# Patient Record
Sex: Female | Born: 1950 | Race: White | Hispanic: No | Marital: Single | Smoking: Never smoker
Health system: Southern US, Community
[De-identification: ages and names within clinical notes are randomized; demographics above are authoritative.]

## PROBLEM LIST (undated history)

## (undated) DIAGNOSIS — E785 Hyperlipidemia, unspecified: Secondary | ICD-10-CM

## (undated) DIAGNOSIS — H269 Unspecified cataract: Secondary | ICD-10-CM

## (undated) DIAGNOSIS — E079 Disorder of thyroid, unspecified: Secondary | ICD-10-CM

## (undated) DIAGNOSIS — M1990 Unspecified osteoarthritis, unspecified site: Secondary | ICD-10-CM

## (undated) HISTORY — PX: HERNIA REPAIR: SHX51

## (undated) HISTORY — DX: Unspecified osteoarthritis, unspecified site: M19.90

## (undated) HISTORY — PX: COLONOSCOPY: SHX174

## (undated) HISTORY — DX: Hyperlipidemia, unspecified: E78.5

## (undated) HISTORY — DX: Unspecified cataract: H26.9

## (undated) HISTORY — DX: Disorder of thyroid, unspecified: E07.9

---

## 1999-09-20 ENCOUNTER — Encounter: Payer: Self-pay | Admitting: Emergency Medicine

## 1999-09-20 ENCOUNTER — Emergency Department (HOSPITAL_COMMUNITY): Admission: EM | Admit: 1999-09-20 | Discharge: 1999-09-20 | Payer: Self-pay | Admitting: Emergency Medicine

## 2000-07-04 ENCOUNTER — Encounter: Payer: Self-pay | Admitting: Obstetrics and Gynecology

## 2000-07-04 ENCOUNTER — Encounter: Admission: RE | Admit: 2000-07-04 | Discharge: 2000-07-04 | Payer: Self-pay | Admitting: Obstetrics and Gynecology

## 2000-10-24 ENCOUNTER — Other Ambulatory Visit: Admission: RE | Admit: 2000-10-24 | Discharge: 2000-10-24 | Payer: Self-pay | Admitting: Obstetrics and Gynecology

## 2001-07-14 ENCOUNTER — Encounter: Payer: Self-pay | Admitting: Obstetrics and Gynecology

## 2001-07-14 ENCOUNTER — Encounter: Admission: RE | Admit: 2001-07-14 | Discharge: 2001-07-14 | Payer: Self-pay | Admitting: Obstetrics and Gynecology

## 2001-11-11 ENCOUNTER — Other Ambulatory Visit: Admission: RE | Admit: 2001-11-11 | Discharge: 2001-11-11 | Payer: Self-pay | Admitting: Obstetrics and Gynecology

## 2002-07-27 ENCOUNTER — Encounter: Payer: Self-pay | Admitting: Obstetrics and Gynecology

## 2002-07-27 ENCOUNTER — Ambulatory Visit (HOSPITAL_COMMUNITY): Admission: RE | Admit: 2002-07-27 | Discharge: 2002-07-27 | Payer: Self-pay | Admitting: Obstetrics and Gynecology

## 2002-12-06 ENCOUNTER — Other Ambulatory Visit: Admission: RE | Admit: 2002-12-06 | Discharge: 2002-12-06 | Payer: Self-pay | Admitting: Obstetrics and Gynecology

## 2003-08-09 ENCOUNTER — Encounter: Payer: Self-pay | Admitting: Obstetrics and Gynecology

## 2003-08-09 ENCOUNTER — Ambulatory Visit (HOSPITAL_COMMUNITY): Admission: RE | Admit: 2003-08-09 | Discharge: 2003-08-09 | Payer: Self-pay | Admitting: Obstetrics and Gynecology

## 2003-12-28 ENCOUNTER — Other Ambulatory Visit: Admission: RE | Admit: 2003-12-28 | Discharge: 2003-12-28 | Payer: Self-pay | Admitting: Obstetrics and Gynecology

## 2004-09-04 ENCOUNTER — Ambulatory Visit (HOSPITAL_COMMUNITY): Admission: RE | Admit: 2004-09-04 | Discharge: 2004-09-04 | Payer: Self-pay | Admitting: Obstetrics and Gynecology

## 2005-01-24 ENCOUNTER — Other Ambulatory Visit: Admission: RE | Admit: 2005-01-24 | Discharge: 2005-01-24 | Payer: Self-pay | Admitting: Obstetrics and Gynecology

## 2005-01-30 DIAGNOSIS — D229 Melanocytic nevi, unspecified: Secondary | ICD-10-CM

## 2005-01-30 HISTORY — DX: Melanocytic nevi, unspecified: D22.9

## 2006-07-07 ENCOUNTER — Ambulatory Visit (HOSPITAL_COMMUNITY): Admission: RE | Admit: 2006-07-07 | Discharge: 2006-07-07 | Payer: Self-pay | Admitting: Obstetrics and Gynecology

## 2007-08-20 ENCOUNTER — Ambulatory Visit (HOSPITAL_COMMUNITY): Admission: RE | Admit: 2007-08-20 | Discharge: 2007-08-20 | Payer: Self-pay | Admitting: Obstetrics and Gynecology

## 2007-09-08 ENCOUNTER — Ambulatory Visit: Payer: Self-pay | Admitting: Internal Medicine

## 2007-09-21 ENCOUNTER — Ambulatory Visit: Payer: Self-pay | Admitting: Internal Medicine

## 2008-09-20 ENCOUNTER — Ambulatory Visit (HOSPITAL_COMMUNITY): Admission: RE | Admit: 2008-09-20 | Discharge: 2008-09-20 | Payer: Self-pay | Admitting: Obstetrics and Gynecology

## 2009-12-14 ENCOUNTER — Ambulatory Visit (HOSPITAL_COMMUNITY): Admission: RE | Admit: 2009-12-14 | Discharge: 2009-12-14 | Payer: Self-pay | Admitting: Obstetrics and Gynecology

## 2010-12-18 ENCOUNTER — Ambulatory Visit (HOSPITAL_COMMUNITY)
Admission: RE | Admit: 2010-12-18 | Discharge: 2010-12-18 | Payer: Self-pay | Source: Home / Self Care | Attending: Obstetrics and Gynecology | Admitting: Obstetrics and Gynecology

## 2012-02-14 ENCOUNTER — Emergency Department (HOSPITAL_COMMUNITY)
Admission: EM | Admit: 2012-02-14 | Discharge: 2012-02-14 | Disposition: A | Discharge status: Home / Self Care | Payer: Self-pay | Attending: Emergency Medicine | Admitting: Emergency Medicine

## 2012-02-14 ENCOUNTER — Encounter (HOSPITAL_COMMUNITY): Payer: Self-pay | Admitting: *Deleted

## 2012-02-14 ENCOUNTER — Emergency Department (HOSPITAL_COMMUNITY): Payer: Self-pay

## 2012-02-14 ENCOUNTER — Other Ambulatory Visit: Payer: Self-pay

## 2012-02-14 DIAGNOSIS — Z79899 Other long term (current) drug therapy: Secondary | ICD-10-CM | POA: Insufficient documentation

## 2012-02-14 DIAGNOSIS — R109 Unspecified abdominal pain: Secondary | ICD-10-CM | POA: Insufficient documentation

## 2012-02-14 LAB — CBC
MCH: 29.6 pg (ref 26.0–34.0)
MCHC: 33.2 g/dL (ref 30.0–36.0)
MCV: 89.2 fL (ref 78.0–100.0)
Platelets: 371 10*3/uL (ref 150–400)
RBC: 4.43 MIL/uL (ref 3.87–5.11)

## 2012-02-14 LAB — DIFFERENTIAL
Eosinophils Absolute: 0.1 10*3/uL (ref 0.0–0.7)
Eosinophils Relative: 2 % (ref 0–5)
Lymphs Abs: 2.2 10*3/uL (ref 0.7–4.0)
Monocytes Relative: 6 % (ref 3–12)

## 2012-02-14 LAB — URINALYSIS, ROUTINE W REFLEX MICROSCOPIC
Nitrite: NEGATIVE
Specific Gravity, Urine: 1.01 (ref 1.005–1.030)
Urobilinogen, UA: 0.2 mg/dL (ref 0.0–1.0)

## 2012-02-14 LAB — COMPREHENSIVE METABOLIC PANEL
BUN: 9 mg/dL (ref 6–23)
Calcium: 9.4 mg/dL (ref 8.4–10.5)
GFR calc Af Amer: >90 mL/min (ref >90)
Glucose, Bld: 102 mg/dL — ABNORMAL HIGH (ref 70–99)
Sodium: 138 mEq/L (ref 135–145)
Total Protein: 7 g/dL (ref 6.0–8.3)

## 2012-02-14 LAB — LIPASE, BLOOD: Lipase: 19 U/L (ref 11–59)

## 2012-02-14 MED ORDER — PANTOPRAZOLE SODIUM 20 MG PO TBEC: 40.0000 mg | DELAYED_RELEASE_TABLET | Freq: Every day | ORAL | Status: DC

## 2012-02-14 MED ORDER — SODIUM CHLORIDE 0.9 % IV SOLN
Freq: Once | INTRAVENOUS | Status: AC
  Administered 2012-02-14: 18:00:00 via INTRAVENOUS

## 2012-02-14 MED ORDER — HYDROCODONE-ACETAMINOPHEN 5-325 MG PO TABS: 1.0000 | ORAL_TABLET | Freq: Four times a day (QID) | ORAL | Status: AC | PRN

## 2012-02-14 MED ORDER — IOHEXOL 300 MG/ML  SOLN
70.0000 mL | Freq: Once | INTRAMUSCULAR | Status: AC | PRN
  Administered 2012-02-14: 70 mL via INTRAVENOUS

## 2012-02-14 MED ORDER — PANTOPRAZOLE SODIUM 40 MG IV SOLR
40.0000 mg | Freq: Once | INTRAVENOUS | Status: AC
  Administered 2012-02-14: 40 mg via INTRAVENOUS
  Filled 2012-02-14: qty 40

## 2012-02-14 NOTE — ED Provider Notes (Signed)
History     CSN: [id number]  Arrival date & time 02/14/12  1316   First MD Initiated Contact with Patient 02/14/12 1543      Chief Complaint  Patient presents with  . Abdominal Pain    (Consider location/radiation/quality/duration/timing/severity/associated sxs/prior treatment) Patient is a 61 y.o. female presenting with abdominal pain. The history is provided by the patient (She states that she). No language interpreter was used.  Abdominal Pain The primary symptoms of the illness include abdominal pain. The primary symptoms of the illness do not include fatigue, shortness of breath or diarrhea. The current episode started more than 2 days ago. The onset of the illness was gradual. The problem has not changed since onset. The illness is associated with NSAID use. The patient states that she believes she is currently not pregnant. The patient has not had a change in bowel habit. Symptoms associated with the illness do not include constipation, hematuria, frequency or back pain. Significant associated medical issues do not include sickle cell disease or liver disease.    History reviewed. No pertinent past medical history.  Past Surgical History  Procedure Date  . Hernia repair     History reviewed. No pertinent family history.  History  Substance Use Topics  . Smoking status: Never Smoker   . Smokeless tobacco: Not on file  . Alcohol Use: No    OB History    Grav Para Term Preterm Abortions TAB SAB Ect Mult Living                  Review of Systems  Constitutional: Negative for fatigue.  HENT: Negative for congestion, sinus pressure and ear discharge.   Eyes: Negative for discharge.  Respiratory: Negative for cough and shortness of breath.   Cardiovascular: Negative for chest pain.  Gastrointestinal: Positive for abdominal pain. Negative for diarrhea and constipation.  Genitourinary: Negative for frequency and hematuria.  Musculoskeletal: Negative for back pain.    Skin: Negative for rash.  Neurological: Negative for seizures and headaches.  Hematological: Negative.   Psychiatric/Behavioral: Negative for hallucinations.    Allergies  Review of patient's allergies indicates no known allergies.  Home Medications   Current Outpatient Rx  Name Route Sig Dispense Refill  . BIOTIN PO Oral Take 1 capsule by mouth daily.    Marland Kitchen CALCIUM PO Oral Take 1 tablet by mouth daily.    Marland Kitchen VITAMIN D 1000 UNITS PO TABS Oral Take 1,000 Units by mouth daily.    Marland Kitchen DOXYCYCLINE HYCLATE 50 MG PO CAPS Oral Take 50 mg by mouth daily as needed. For rosacea    . ESTRADIOL 0.5 MG PO TABS Oral Take 0.5 mg by mouth daily.    Marland Kitchen PROGESTERONE MICRONIZED 100 MG PO CAPS Oral Take 100 mg by mouth daily.    Marland Kitchen VALACYCLOVIR HCL 500 MG PO TABS Oral Take 500 mg by mouth daily as needed. For outbreak    . HYDROCODONE-ACETAMINOPHEN 5-325 MG PO TABS Oral Take 1 tablet by mouth every 6 (six) hours as needed for pain. 20 tablet 0  . PANTOPRAZOLE SODIUM 20 MG PO TBEC Oral Take 2 tablets (40 mg total) by mouth daily. 30 tablet 1    BP 160/78  Pulse 94  Temp(Src) 97.8 F (36.6 C) (Oral)  Resp 16  SpO2 100%  Physical Exam  Constitutional: She is oriented to person, place, and time. She appears well-developed.  HENT:  Head: Normocephalic and atraumatic.  Eyes: Conjunctivae and EOM are normal. No scleral icterus.  Neck: Neck supple. No thyromegaly present.  Cardiovascular: Normal rate and regular rhythm.  Exam reveals no gallop and no friction rub.   No murmur heard. Pulmonary/Chest: No stridor. She has no wheezes. She has no rales. She exhibits no tenderness.  Abdominal: She exhibits no distension. There is no tenderness. There is no rebound.  Musculoskeletal: Normal range of motion. She exhibits no edema.  Lymphadenopathy:    She has no cervical adenopathy.  Neurological: She is oriented to person, place, and time. Coordination normal.  Skin: No rash noted. No erythema.  Psychiatric:  She has a normal mood and affect. Her behavior is normal.    ED Course  Procedures (including critical care time)  Labs Reviewed  COMPREHENSIVE METABOLIC PANEL - Abnormal; Notable for the following:    Potassium 3.3 (*)    Glucose, Bld 102 (*)    All other components within normal limits  CBC  DIFFERENTIAL  LIPASE, BLOOD  URINALYSIS, ROUTINE W REFLEX MICROSCOPIC   Ct Abdomen Pelvis W Contrast  02/14/2012  *RADIOLOGY REPORT*  Clinical Data: Abdominal pain  CT ABDOMEN AND PELVIS WITH CONTRAST  Technique:  Multidetector CT imaging of the abdomen and pelvis was performed following the standard protocol during bolus administration of intravenous contrast.  Contrast: 70mL OMNIPAQUE IOHEXOL 300 MG/ML IJ SOLN  Comparison: None.  Findings: Lung bases are clear.  No pericardial fluid.  No focal hepatic lesion.  The gallbladder, pancreas, spleen, adrenal glands, and kidneys are normal.  The stomach is normal.  There is small hiatal hernia.  Duodenum, small bowel, and cecum are normal.  Appendix is not identified. There is no pericecal inflammation.  Colon and rectosigmoid colon are normal.  Abdominal aorta normal caliber.  No retroperitoneal periportal lymphadenopathy.  No free fluid the pelvis.  The bladder and uterus and ovaries are normal.  No pelvic lymphadenopathy. Review of  bone windows demonstrates no aggressive osseous lesions.  IMPRESSION: No acute abdominal or pelvic findings.  Small hiatal hernia.  Original Report Authenticated By: Genevive Bi, M.D.     1. Abdominal pain       MDM  abd pain with normal ct.  gerd        Benny Lennert, MD 02/14/12 2015

## 2012-02-14 NOTE — Discharge Instructions (Signed)
Follow up with your md if not improving. °

## 2012-02-14 NOTE — ED Notes (Signed)
Pt reports upper abdominal pain since last Thursday. States pain is mainly at night after laying down. Reports mild nausea. Pt went to PCP 2 days ago and states lab was wnl.

## 2012-02-14 NOTE — ED Notes (Signed)
Assumed care of pt.  No distress noted.  Pt denies pain.  Sitting up in bed talking with family.

## 2012-03-27 ENCOUNTER — Other Ambulatory Visit (HOSPITAL_COMMUNITY): Payer: Self-pay | Admitting: Obstetrics and Gynecology

## 2012-03-27 DIAGNOSIS — Z1231 Encounter for screening mammogram for malignant neoplasm of breast: Secondary | ICD-10-CM

## 2012-04-23 ENCOUNTER — Ambulatory Visit (HOSPITAL_COMMUNITY)
Admission: RE | Admit: 2012-04-23 | Discharge: 2012-04-23 | Disposition: A | Discharge status: Home / Self Care | Payer: Self-pay | Source: Ambulatory Visit | Attending: Obstetrics and Gynecology | Admitting: Obstetrics and Gynecology

## 2012-04-23 DIAGNOSIS — Z1231 Encounter for screening mammogram for malignant neoplasm of breast: Secondary | ICD-10-CM

## 2013-05-21 ENCOUNTER — Ambulatory Visit
Admission: RE | Admit: 2013-05-21 | Discharge: 2013-05-21 | Disposition: A | Discharge status: Home / Self Care | Payer: BC Managed Care – PPO | Source: Ambulatory Visit | Attending: Internal Medicine | Admitting: Internal Medicine

## 2013-05-21 ENCOUNTER — Other Ambulatory Visit: Payer: Self-pay | Admitting: Internal Medicine

## 2013-05-21 DIAGNOSIS — M25551 Pain in right hip: Secondary | ICD-10-CM

## 2013-05-27 ENCOUNTER — Other Ambulatory Visit: Payer: Self-pay

## 2013-05-27 DIAGNOSIS — Z1231 Encounter for screening mammogram for malignant neoplasm of breast: Secondary | ICD-10-CM

## 2013-06-14 ENCOUNTER — Ambulatory Visit
Admission: RE | Admit: 2013-06-14 | Discharge: 2013-06-14 | Disposition: A | Discharge status: Home / Self Care | Payer: BC Managed Care – PPO | Source: Ambulatory Visit

## 2013-06-14 DIAGNOSIS — Z1231 Encounter for screening mammogram for malignant neoplasm of breast: Secondary | ICD-10-CM

## 2014-05-30 ENCOUNTER — Other Ambulatory Visit: Payer: Self-pay

## 2014-05-30 DIAGNOSIS — Z1231 Encounter for screening mammogram for malignant neoplasm of breast: Secondary | ICD-10-CM

## 2014-06-22 ENCOUNTER — Ambulatory Visit
Admission: RE | Admit: 2014-06-22 | Discharge: 2014-06-22 | Disposition: A | Discharge status: Home / Self Care | Payer: 59 | Source: Ambulatory Visit

## 2014-06-22 DIAGNOSIS — Z1231 Encounter for screening mammogram for malignant neoplasm of breast: Secondary | ICD-10-CM

## 2015-04-06 ENCOUNTER — Other Ambulatory Visit: Payer: Self-pay | Admitting: Internal Medicine

## 2015-04-06 DIAGNOSIS — E041 Nontoxic single thyroid nodule: Secondary | ICD-10-CM

## 2015-04-11 ENCOUNTER — Ambulatory Visit
Admission: RE | Admit: 2015-04-11 | Discharge: 2015-04-11 | Disposition: A | Discharge status: Home / Self Care | Payer: 59 | Source: Ambulatory Visit | Attending: Internal Medicine | Admitting: Internal Medicine

## 2015-04-11 DIAGNOSIS — E041 Nontoxic single thyroid nodule: Secondary | ICD-10-CM

## 2015-05-16 ENCOUNTER — Other Ambulatory Visit: Payer: Self-pay

## 2015-05-16 DIAGNOSIS — Z1231 Encounter for screening mammogram for malignant neoplasm of breast: Secondary | ICD-10-CM

## 2015-06-27 ENCOUNTER — Ambulatory Visit: Payer: 59

## 2015-06-28 ENCOUNTER — Ambulatory Visit: Payer: 59

## 2015-07-10 ENCOUNTER — Ambulatory Visit
Admission: RE | Admit: 2015-07-10 | Discharge: 2015-07-10 | Disposition: A | Discharge status: Home / Self Care | Payer: 59 | Source: Ambulatory Visit

## 2015-07-10 DIAGNOSIS — Z1231 Encounter for screening mammogram for malignant neoplasm of breast: Secondary | ICD-10-CM

## 2016-04-09 ENCOUNTER — Other Ambulatory Visit: Payer: Self-pay | Admitting: Obstetrics and Gynecology

## 2016-04-09 DIAGNOSIS — E041 Nontoxic single thyroid nodule: Secondary | ICD-10-CM

## 2016-04-12 ENCOUNTER — Ambulatory Visit
Admission: RE | Admit: 2016-04-12 | Discharge: 2016-04-12 | Disposition: A | Discharge status: Home / Self Care | Payer: PPO | Source: Ambulatory Visit | Attending: Obstetrics and Gynecology | Admitting: Obstetrics and Gynecology

## 2016-04-12 DIAGNOSIS — E041 Nontoxic single thyroid nodule: Secondary | ICD-10-CM

## 2016-06-24 ENCOUNTER — Other Ambulatory Visit: Payer: Self-pay | Admitting: Obstetrics and Gynecology

## 2016-06-24 DIAGNOSIS — Z1231 Encounter for screening mammogram for malignant neoplasm of breast: Secondary | ICD-10-CM

## 2016-07-17 ENCOUNTER — Ambulatory Visit
Admission: RE | Admit: 2016-07-17 | Discharge: 2016-07-17 | Disposition: A | Discharge status: Home / Self Care | Payer: PPO | Source: Ambulatory Visit | Attending: Obstetrics and Gynecology | Admitting: Obstetrics and Gynecology

## 2016-07-17 DIAGNOSIS — Z1231 Encounter for screening mammogram for malignant neoplasm of breast: Secondary | ICD-10-CM

## 2017-01-15 ENCOUNTER — Ambulatory Visit (INDEPENDENT_AMBULATORY_CARE_PROVIDER_SITE_OTHER): Payer: Medicare HMO | Admitting: Urgent Care

## 2017-01-15 VITALS — BP 138/80 | HR 88 | Temp 98.1°F | Resp 16 | Ht 65.5 in | Wt 142.6 lb

## 2017-01-15 DIAGNOSIS — J209 Acute bronchitis, unspecified: Secondary | ICD-10-CM | POA: Diagnosis not present

## 2017-01-15 DIAGNOSIS — R05 Cough: Secondary | ICD-10-CM | POA: Diagnosis not present

## 2017-01-15 DIAGNOSIS — R059 Cough, unspecified: Secondary | ICD-10-CM

## 2017-01-15 MED ORDER — AZITHROMYCIN 250 MG PO TABS: ORAL_TABLET | ORAL | 0 refills | Status: DC

## 2017-01-15 MED ORDER — BENZONATATE 100 MG PO CAPS: 100.0000 mg | ORAL_CAPSULE | Freq: Three times a day (TID) | ORAL | 0 refills | Status: DC | PRN

## 2017-01-15 MED ORDER — HYDROCOD POLST-CPM POLST ER 10-8 MG/5ML PO SUER: 5.0000 mL | Freq: Every evening | ORAL | 0 refills | Status: DC | PRN

## 2017-01-15 NOTE — Progress Notes (Signed)
  MRN: [id number] DOB: 1951-09-27  Subjective:   Kimberly Tran is a 66 y.o. female presenting for chief complaint of Bronchitis  Reports 1 week history of dry cough that elicits intermittent mild chest pain. Feels more easily winded, fatigued one day this week. Associated symptoms include losing her voice (now resolved), dull headache, bilateral ear pressure, nasal congestion, scratchy throat. Most symptoms are improved except for cough. Has not tried medications for relief. Patient is hydrating well. Denies fever, dizziness, eye pain, eye redness, sinus pain, ear drainage, sore throat, wheezing, n/v, abdominal pain, body aches, rashes, decreased appetite. Denies history of asthma, allergies. Did not take her flu shot this past season. Denies smoking cigarettes or drinking alcohol.   Tinzlee has a current medication list which includes the following prescription(s): biotin, calcium, cholecalciferol, estradiol, progesterone, valacyclovir, doxycycline, and pantoprazole. Also has No Known Allergies. Roniece has history of thyroid disease, cataracts. Also  has a past surgical history that includes Hernia repair. Her family history includes Cancer in her mother; Diabetes in her father; Heart disease in her father; Hypertension in her brother. Mother had history of lung cancer.   Objective:   Vitals: BP 138/80 (BP Location: Right Arm, Patient Position: Sitting, Cuff Size: Normal)   Pulse 88   Temp 98.1 F (36.7 C) (Oral)   Resp 16   Ht 5' 5.5" (1.664 m)   Wt 142 lb 9.6 oz (64.7 kg)   SpO2 97%   BMI 23.37 kg/m   Physical Exam  Constitutional: She is oriented to person, place, and time. She appears well-developed and well-nourished.  HENT:  TM's intact bilaterally, no effusions or erythema. Nasal turbinates dry with thick yellow mucus, nasal passages patent. No sinus tenderness. Oropharynx clear, mucous membranes moist, dentition in good repair.  Eyes: Right eye exhibits no discharge. Left  eye exhibits no discharge.  Neck: Normal range of motion. Neck supple.  Cardiovascular: Normal rate, regular rhythm and intact distal pulses.  Exam reveals no gallop and no friction rub.   No murmur heard. Pulmonary/Chest: No respiratory distress. She has no wheezes. She has no rales.  Ronchi mid-lower lung fields bilaterally.  Lymphadenopathy:    She has no cervical adenopathy.  Neurological: She is alert and oriented to person, place, and time.  Skin: Skin is warm and dry.   Assessment and Plan :   1. Acute bronchitis, unspecified organism 2. Cough - Will cover for infectious process with azithromycin. Recommended cough suppression, supportive care otherwise, rtc in 1 week if no improvement.  Jaynee Eagles, PA-C [facility] [phone] 01/15/2017  9:57 AM

## 2017-01-15 NOTE — Addendum Note (Signed)
Addended by: Virgia Land on: 01/15/2017 10:59 AM   Modules accepted: Orders

## 2017-01-15 NOTE — Patient Instructions (Addendum)
    Acute Bronchitis, Adult Acute bronchitis is when air tubes (bronchi) in the lungs suddenly get swollen. The condition can make it hard to breathe. It can also cause these symptoms:  A cough.  Coughing up clear, yellow, or green mucus.  Wheezing.  Chest congestion.  Shortness of breath.  A fever.  Body aches.  Chills.  A sore throat. Follow these instructions at home: Medicines   Take over-the-counter and prescription medicines only as told by your doctor.  If you were prescribed an antibiotic medicine, take it as told by your doctor. Do not stop taking the antibiotic even if you start to feel better. General instructions   Rest.  Drink enough fluids to keep your pee (urine) clear or pale yellow.  Avoid smoking and secondhand smoke. If you smoke and you need help quitting, ask your doctor. Quitting will help your lungs heal faster.  Use an inhaler, cool mist vaporizer, or humidifier as told by your doctor.  Keep all follow-up visits as told by your doctor. This is important. How is this prevented? To lower your risk of getting this condition again:  Wash your hands often with soap and water. If you cannot use soap and water, use hand sanitizer.  Avoid contact with people who have cold symptoms.  Try not to touch your hands to your mouth, nose, or eyes.  Make sure to get the flu shot every year. Contact a doctor if:  Your symptoms do not get better in 2 weeks. Get help right away if:  You cough up blood.  You have chest pain.  You have very bad shortness of breath.  You become dehydrated.  You faint (pass out) or keep feeling like you are going to pass out.  You keep throwing up (vomiting).  You have a very bad headache.  Your fever or chills gets worse. This information is not intended to replace advice given to you by your health care provider. Make sure you discuss any questions you have with your health care provider. Document Released:  05/20/2008 Document Revised: 07/10/2016 Document Reviewed: 05/22/2016 Elsevier Interactive Patient Education  2017 Elsevier Inc.   IF you received an x-ray today, you will receive an invoice from [facility]. Please contact [facility] at [phone] with questions or concerns regarding your invoice.   IF you received labwork today, you will receive an invoice from [facility]. Please contact [facility] at [phone] with questions or concerns regarding your invoice.   Our billing staff will not be able to assist you with questions regarding bills from these companies.  You will be contacted with the lab results as soon as they are available. The fastest way to get your results is to activate your My Chart account. Instructions are located on the last page of this paperwork. If you have not heard from us regarding the results in 2 weeks, please contact this office.      

## 2017-01-22 ENCOUNTER — Ambulatory Visit (INDEPENDENT_AMBULATORY_CARE_PROVIDER_SITE_OTHER): Payer: Medicare HMO | Admitting: Urgent Care

## 2017-01-22 ENCOUNTER — Ambulatory Visit (INDEPENDENT_AMBULATORY_CARE_PROVIDER_SITE_OTHER): Payer: Medicare HMO

## 2017-01-22 VITALS — BP 160/86 | HR 83 | Temp 98.0°F | Resp 17 | Ht 65.5 in | Wt 143.0 lb

## 2017-01-22 DIAGNOSIS — R059 Cough, unspecified: Secondary | ICD-10-CM

## 2017-01-22 DIAGNOSIS — R030 Elevated blood-pressure reading, without diagnosis of hypertension: Secondary | ICD-10-CM | POA: Diagnosis not present

## 2017-01-22 DIAGNOSIS — J209 Acute bronchitis, unspecified: Secondary | ICD-10-CM

## 2017-01-22 DIAGNOSIS — R05 Cough: Secondary | ICD-10-CM

## 2017-01-22 MED ORDER — ALBUTEROL SULFATE 108 (90 BASE) MCG/ACT IN AEPB: 1.0000 "application " | INHALATION_SPRAY | Freq: Two times a day (BID) | RESPIRATORY_TRACT | 0 refills | Status: DC

## 2017-01-22 MED ORDER — ALBUTEROL SULFATE HFA 108 (90 BASE) MCG/ACT IN AERS: 2.0000 | INHALATION_SPRAY | Freq: Three times a day (TID) | RESPIRATORY_TRACT | 1 refills | Status: DC | PRN

## 2017-01-22 NOTE — Patient Instructions (Signed)
Cough, Adult Coughing is a reflex that clears your throat and your airways. Coughing helps to heal and protect your lungs. It is normal to cough occasionally, but a cough that happens with other symptoms or lasts a long time may be a sign of a condition that needs treatment. A cough may last only 2-3 weeks (acute), or it may last longer than 8 weeks (chronic). What are the causes? Coughing is commonly caused by:  Breathing in substances that irritate your lungs.  A viral or bacterial respiratory infection.  Allergies.  Asthma.  Postnasal drip.  Smoking.  Acid backing up from the stomach into the esophagus (gastroesophageal reflux).  Certain medicines.  Chronic lung problems, including COPD (or rarely, lung cancer).  Other medical conditions such as heart failure. Follow these instructions at home: Pay attention to any changes in your symptoms. Take these actions to help with your discomfort:  Take medicines only as told by your health care provider.  If you were prescribed an antibiotic medicine, take it as told by your health care provider. Do not stop taking the antibiotic even if you start to feel better.  Talk with your health care provider before you take a cough suppressant medicine.  Drink enough fluid to keep your urine clear or pale yellow.  If the air is dry, use a cold steam vaporizer or humidifier in your bedroom or your home to help loosen secretions.  Avoid anything that causes you to cough at work or at home.  If your cough is worse at night, try sleeping in a semi-upright position.  Avoid cigarette smoke. If you smoke, quit smoking. If you need help quitting, ask your health care provider.  Avoid caffeine.  Avoid alcohol.  Rest as needed. Contact a health care provider if:  You have new symptoms.  You cough up pus.  Your cough does not get better after 2-3 weeks, or your cough gets worse.  You cannot control your cough with suppressant medicines  and you are losing sleep.  You develop pain that is getting worse or pain that is not controlled with pain medicines.  You have a fever.  You have unexplained weight loss.  You have night sweats. Get help right away if:  You cough up blood.  You have difficulty breathing.  Your heartbeat is very fast. This information is not intended to replace advice given to you by your health care provider. Make sure you discuss any questions you have with your health care provider. Document Released: 05/31/2011 Document Revised: 05/09/2016 Document Reviewed: 02/08/2015 Elsevier Interactive Patient Education  2017 Elsevier Inc.     IF you received an x-ray today, you will receive an invoice from [facility]. Please contact [facility] at [phone] with questions or concerns regarding your invoice.   IF you received labwork today, you will receive an invoice from [facility]. Please contact [facility] at [phone] with questions or concerns regarding your invoice.   Our billing staff will not be able to assist you with questions regarding bills from these companies.  You will be contacted with the lab results as soon as they are available. The fastest way to get your results is to activate your My Chart account. Instructions are located on the last page of this paperwork. If you have not heard from us regarding the results in 2 weeks, please contact this office.      

## 2017-01-22 NOTE — Progress Notes (Signed)
   MRN: [id number] DOB: 1951/07/19  Subjective:   Kimberly Tran is a 66 y.o. female presenting for follow up on acute bronchitis. Patient's last OV was 01/15/2017, start on azithromycin and supportive care. Today, she reports her cough is improved but still there and associated with wheezing. Of note, patient has not used cough suppression medications prescribed. She denies fever, chest pain, shob, n/v, abdominal pain, sore throat. Denies smoking cigarettes. Denies asthma.   Kimberly Tran has a current medication list which includes the following prescription(s): azithromycin, benzonatate, biotin, calcium, chlorpheniramine-hydrocodone, cholecalciferol, doxycycline, estradiol, pantoprazole, progesterone, and valacyclovir. Also has No Known Allergies.  Kimberly Tran  has a past medical history of Cataract and Thyroid disease. Also  has a past surgical history that includes Hernia repair.  Objective:   Vitals: BP (!) 168/80 (BP Location: Left Arm, Patient Position: Sitting, Cuff Size: Normal)   Pulse 83   Temp 98 F (36.7 C) (Oral)   Resp 17   Ht 5' 5.5" (1.664 m)   Wt 143 lb (64.9 kg)   SpO2 98%   BMI 23.43 kg/m   BP Readings from Last 3 Encounters:  01/22/17 (!) 168/80  01/15/17 138/80  02/14/12 146/85    Physical Exam  Constitutional: She is oriented to person, place, and time. She appears well-developed and well-nourished.  HENT:  Mouth/Throat: Oropharynx is clear and moist.  Cardiovascular: Normal rate, regular rhythm and intact distal pulses.  Exam reveals no gallop and no friction rub.   No murmur heard. Pulmonary/Chest: No respiratory distress. She has wheezes (mild, throughout). She has no rales.  Neurological: She is alert and oriented to person, place, and time.  Skin: Skin is warm and dry.   Dg Chest 2 View  Result Date: 01/22/2017 CLINICAL DATA:  Cough. EXAM: CHEST  2 VIEW COMPARISON:  None. FINDINGS: The heart size and mediastinal contours are within normal limits. Both lungs  are clear. No pneumothorax or pleural effusion is noted. Atherosclerosis of thoracic aorta is noted. The visualized skeletal structures are unremarkable. IMPRESSION: No active cardiopulmonary disease.  Aortic atherosclerosis. Electronically Signed   By: Marijo Conception, M.D.   On: 01/22/2017 14:35   Assessment and Plan :   1. Cough 2. Acute bronchitis, unspecified organism - Start albuterol inhaler 2-3 times daily. X-ray findings reassuring. Patient agreed to start cough suppression medications.  3. Elevated blood pressure reading - Check BP at home once weekly. RTC in 4 weeks for BP recheck. Consider starting BP medications.  Jaynee Eagles, PA-C [facility] [phone] 01/22/2017 2:00 PM

## 2017-02-19 ENCOUNTER — Encounter: Payer: Self-pay | Admitting: Physician Assistant

## 2017-02-19 ENCOUNTER — Ambulatory Visit (INDEPENDENT_AMBULATORY_CARE_PROVIDER_SITE_OTHER): Payer: Medicare HMO | Admitting: Physician Assistant

## 2017-02-19 VITALS — BP 132/64 | HR 88 | Temp 98.7°F | Resp 18 | Ht 65.5 in | Wt 143.0 lb

## 2017-02-19 DIAGNOSIS — Z09 Encounter for follow-up examination after completed treatment for conditions other than malignant neoplasm: Secondary | ICD-10-CM

## 2017-02-19 DIAGNOSIS — R030 Elevated blood-pressure reading, without diagnosis of hypertension: Secondary | ICD-10-CM

## 2017-02-19 NOTE — Progress Notes (Signed)
PRIMARY CARE AT [facility] [address] [place] [address] [phone]  Date:  02/19/2017   Name:  ARLEY GARANT   DOB:  [birth year]   MRN:  [id number]  PCP:  Jaynee Eagles, PA-C    History of Present Illness:  EVENY ANASTAS is a 66 y.o. female patient who presents to [facility] for cc of bp. --patient is here for bp recheck.   --pt seen here for acute bronchitis.  At follow up visit 4 weeks ago, bp was elevated at 168/80.  She was advised to return to clinic in 4 weeks for bp recheck.  She was not on any decongestants at the time.  She reports that she has no cp, palpitations, sob, vision changes, or leg swelling.  bp has remained in the 130's/80's.  She does not exercise at this time.  Adds salt to every meal.   BP Readings from Last 3 Encounters:  02/19/17 132/64  01/22/17 (!) 160/86  01/15/17 138/80   Primary care has never reported elevated bp, Dr. Nyoka Cowden.   Last thyroid check was wnl 3 months ago.       There are no active problems to display for this patient.   Past Medical History:  Diagnosis Date  . Cataract   . Thyroid disease     Past Surgical History:  Procedure Laterality Date  . HERNIA REPAIR      Social History  Substance Use Topics  . Smoking status: Never Smoker  . Smokeless tobacco: Never Used  . Alcohol use No    Family History  Problem Relation Age of Onset  . Cancer Mother     Lung  . Diabetes Father   . Heart disease Father   . Hypertension Brother     No Known Allergies  Medication list has been reviewed and updated.  Current Outpatient Prescriptions on File Prior to Visit  Medication Sig Dispense Refill  . albuterol (PROVENTIL HFA;VENTOLIN HFA) 108 (90 Base) MCG/ACT inhaler Inhale 2 puffs into the lungs every 8 (eight) hours as needed for wheezing or shortness of breath (cough, shortness of breath or wheezing.). 1 Inhaler 1  . BIOTIN PO Take 1 capsule by mouth daily.    Marland Kitchen CALCIUM PO Take 1 tablet by mouth daily.    Marland Kitchen doxycycline  (VIBRAMYCIN) 50 MG capsule Take 50 mg by mouth daily as needed. For rosacea    . estradiol (ESTRACE) 0.5 MG tablet Take 0.5 mg by mouth daily.    Marland Kitchen levothyroxine (SYNTHROID, LEVOTHROID) 50 MCG tablet Take 50 mcg by mouth daily. as directed  0  . progesterone (PROMETRIUM) 100 MG capsule Take 100 mg by mouth daily.    . cholecalciferol (VITAMIN D) 1000 UNITS tablet Take 1,000 Units by mouth daily.    . valACYclovir (VALTREX) 500 MG tablet Take 500 mg by mouth daily as needed. For outbreak     No current facility-administered medications on file prior to visit.     ROS ROS otherwise unremarkable unless listed above.   Physical Examination: BP (!) 142/82 (BP Location: Right Arm, Patient Position: Sitting, Cuff Size: Small)   Pulse 88   Temp 98.7 F (37.1 C) (Oral)   Resp 18   Ht 5' 5.5" (1.664 m)   Wt 143 lb (64.9 kg)   SpO2 99%   BMI 23.43 kg/m  Ideal Body Weight: Weight in (lb) to have BMI = 25: 152.2  Physical Exam  Constitutional: She is oriented to person, place, and time. She appears  well-developed and well-nourished. No distress.  HENT:  Head: Normocephalic and atraumatic.  Right Ear: External ear normal.  Left Ear: External ear normal.  Eyes: Conjunctivae and EOM are normal. Pupils are equal, round, and reactive to light.  Neck: No thyroid mass and no thyromegaly present.  Cardiovascular: Normal rate, regular rhythm and normal heart sounds.  Exam reveals no gallop and no friction rub.   No murmur heard. Pulses:      Carotid pulses are 2+ on the right side, and 2+ on the left side.      Radial pulses are 2+ on the right side, and 2+ on the left side.       Dorsalis pedis pulses are 2+ on the right side, and 2+ on the left side.  Pulmonary/Chest: Effort normal. No respiratory distress.  Neurological: She is alert and oriented to person, place, and time.  Skin: She is not diaphoretic.  Psychiatric: She has a normal mood and affect. Her behavior is normal.      Assessment and Plan: EVALYSE STROOPE is a 66 y.o. female who is here today for bp recheck. This is very minimal.  She will meet with pcp in 3 months ago, and advised to address this. Dash diet given.  Advised to check bp once per week.  If over 140/90 rtc or to pcp at other practice. Follow up  Elevated blood pressure reading  Ivar Drape, PA-C [facility] 3/8/20187:21 AM

## 2017-02-19 NOTE — Patient Instructions (Addendum)
Check your blood pressure at least once per week.  If it is over 140/90, then report this to [place] or your primary care.    DASH Eating Plan DASH stands for "Dietary Approaches to Stop Hypertension." The DASH eating plan is a healthy eating plan that has been shown to reduce high blood pressure (hypertension). It may also reduce your risk for type 2 diabetes, heart disease, and stroke. The DASH eating plan may also help with weight loss. What are tips for following this plan? General guidelines   Avoid eating more than 2,300 mg (milligrams) of salt (sodium) a day. If you have hypertension, you may need to reduce your sodium intake to 1,500 mg a day.  Limit alcohol intake to no more than 1 drink a day for nonpregnant women and 2 drinks a day for men. One drink equals 12 oz of beer, 5 oz of wine, or 1 oz of hard liquor.  Work with your health care provider to maintain a healthy body weight or to lose weight. Ask what an ideal weight is for you.  Get at least 30 minutes of exercise that causes your heart to beat faster (aerobic exercise) most days of the week. Activities may include walking, swimming, or biking.  Work with your health care provider or diet and nutrition specialist (dietitian) to adjust your eating plan to your individual calorie needs. Reading food labels   Check food labels for the amount of sodium per serving. Choose foods with less than 5 percent of the Daily Value of sodium. Generally, foods with less than 300 mg of sodium per serving fit into this eating plan.  To find whole grains, look for the word "whole" as the first word in the ingredient list. Shopping   Buy products labeled as "low-sodium" or "no salt added."  Buy fresh foods. Avoid canned foods and premade or frozen meals. Cooking   Avoid adding salt when cooking. Use salt-free seasonings or herbs instead of table salt or sea salt. Check with your health care provider or pharmacist before using salt  substitutes.  Do not fry foods. Cook foods using healthy methods such as baking, boiling, grilling, and broiling instead.  Cook with heart-healthy oils, such as olive, canola, soybean, or sunflower oil. Meal planning    Eat a balanced diet that includes:  5 or more servings of fruits and vegetables each day. At each meal, try to fill half of your plate with fruits and vegetables.  Up to 6-8 servings of whole grains each day.  Less than 6 oz of lean meat, poultry, or fish each day. A 3-oz serving of meat is about the same size as a deck of cards. One egg equals 1 oz.  2 servings of low-fat dairy each day.  A serving of nuts, seeds, or beans 5 times each week.  Heart-healthy fats. Healthy fats called Omega-3 fatty acids are found in foods such as flaxseeds and coldwater fish, like sardines, salmon, and mackerel.  Limit how much you eat of the following:  Canned or prepackaged foods.  Food that is high in trans fat, such as fried foods.  Food that is high in saturated fat, such as fatty meat.  Sweets, desserts, sugary drinks, and other foods with added sugar.  Full-fat dairy products.  Do not salt foods before eating.  Try to eat at least 2 vegetarian meals each week.  Eat more home-cooked food and less restaurant, buffet, and fast food.  When eating at a restaurant, ask  that your food be prepared with less salt or no salt, if possible. What foods are recommended? The items listed may not be a complete list. Talk with your dietitian about what dietary choices are best for you. Grains  Whole-grain or whole-wheat bread. Whole-grain or whole-wheat pasta. Brown rice. Kimberly Tran. Bulgur. Whole-grain and low-sodium cereals. Pita bread. Low-fat, low-sodium crackers. Whole-wheat flour tortillas. Vegetables  Fresh or frozen vegetables (raw, steamed, roasted, or grilled). Low-sodium or reduced-sodium tomato and vegetable juice. Low-sodium or reduced-sodium tomato sauce and  tomato paste. Low-sodium or reduced-sodium canned vegetables. Fruits  All fresh, dried, or frozen fruit. Canned fruit in natural juice (without added sugar). Meat and other protein foods  Skinless chicken or [place]. Ground chicken or [place]. Pork with fat trimmed off. Fish and seafood. Egg whites. Dried beans, peas, or lentils. Unsalted nuts, nut butters, and seeds. Unsalted canned beans. Lean cuts of beef with fat trimmed off. Low-sodium, lean deli meat. Dairy  Low-fat (1%) or fat-free (skim) milk. Fat-free, low-fat, or reduced-fat cheeses. Nonfat, low-sodium ricotta or cottage cheese. Low-fat or nonfat yogurt. Low-fat, low-sodium cheese. Fats and oils  Soft margarine without trans fats. Vegetable oil. Low-fat, reduced-fat, or light mayonnaise and salad dressings (reduced-sodium). Canola, safflower, olive, soybean, and sunflower oils. Avocado. Seasoning and other foods  Herbs. Spices. Seasoning mixes without salt. Unsalted popcorn and pretzels. Fat-free sweets. What foods are not recommended? The items listed may not be a complete list. Talk with your dietitian about what dietary choices are best for you. Grains  Baked goods made with fat, such as croissants, muffins, or some breads. Dry pasta or rice meal packs. Vegetables  Creamed or fried vegetables. Vegetables in a cheese sauce. Regular canned vegetables (not low-sodium or reduced-sodium). Regular canned tomato sauce and paste (not low-sodium or reduced-sodium). Regular tomato and vegetable juice (not low-sodium or reduced-sodium). Kimberly Tran. Olives. Fruits  Canned fruit in a light or heavy syrup. Fried fruit. Fruit in cream or butter sauce. Meat and other protein foods  Fatty cuts of meat. Ribs. Fried meat. Kimberly Tran. Sausage. Bologna and other processed lunch meats. Salami. Fatback. Hotdogs. Bratwurst. Salted nuts and seeds. Canned beans with added salt. Canned or smoked fish. Whole eggs or egg yolks. Chicken or [place] with skin. Dairy  Whole  or 2% milk, cream, and half-and-half. Whole or full-fat cream cheese. Whole-fat or sweetened yogurt. Full-fat cheese. Nondairy creamers. Whipped toppings. Processed cheese and cheese spreads. Fats and oils  Butter. Stick margarine. Lard. Shortening. Ghee. Bacon fat. Tropical oils, such as coconut, palm kernel, or palm oil. Seasoning and other foods  Salted popcorn and pretzels. Onion salt, garlic salt, seasoned salt, table salt, and sea salt. Worcestershire sauce. Tartar sauce. Barbecue sauce. Teriyaki sauce. Soy sauce, including reduced-sodium. Steak sauce. Canned and packaged gravies. Fish sauce. Oyster sauce. Cocktail sauce. Horseradish that you find on the shelf. Ketchup. Mustard. Meat flavorings and tenderizers. Bouillon cubes. Hot sauce and Tabasco sauce. Premade or packaged marinades. Premade or packaged taco seasonings. Relishes. Regular salad dressings. Where to find more information:  National Heart, Lung, and [place]: https://wilson-eaton.com/  American Heart Association: www.heart.org Summary  The DASH eating plan is a healthy eating plan that has been shown to reduce high blood pressure (hypertension). It may also reduce your risk for type 2 diabetes, heart disease, and stroke.  With the DASH eating plan, you should limit salt (sodium) intake to 2,300 mg a day. If you have hypertension, you may need to reduce your sodium intake to 1,500 mg a  day.  When on the DASH eating plan, aim to eat more fresh fruits and vegetables, whole grains, lean proteins, low-fat dairy, and heart-healthy fats.  Work with your health care provider or diet and nutrition specialist (dietitian) to adjust your eating plan to your individual calorie needs. This information is not intended to replace advice given to you by your health care provider. Make sure you discuss any questions you have with your health care provider. Document Released: 11/21/2011 Document Revised: 11/25/2016 Document Reviewed:  11/25/2016 Elsevier Interactive Patient Education  2017 [employer].     IF you received an x-ray today, you will receive an invoice from [facility]. Please contact [facility] at [phone] with questions or concerns regarding your invoice.   IF you received labwork today, you will receive an invoice from [facility]. Please contact [facility] at [phone] with questions or concerns regarding your invoice.   Our billing staff will not be able to assist you with questions regarding bills from these companies.  You will be contacted with the lab results as soon as they are available. The fastest way to get your results is to activate your My Chart account. Instructions are located on the last page of this paperwork. If you have not heard from [place] regarding the results in 2 weeks, please contact this office.

## 2017-02-25 ENCOUNTER — Ambulatory Visit (INDEPENDENT_AMBULATORY_CARE_PROVIDER_SITE_OTHER): Payer: Medicare HMO | Admitting: Physician Assistant

## 2017-02-25 VITALS — BP 158/96 | HR 76 | Temp 97.9°F | Resp 16 | Ht 65.5 in | Wt 141.0 lb

## 2017-02-25 DIAGNOSIS — M62838 Other muscle spasm: Secondary | ICD-10-CM

## 2017-02-25 DIAGNOSIS — I10 Essential (primary) hypertension: Secondary | ICD-10-CM | POA: Diagnosis not present

## 2017-02-25 MED ORDER — MELOXICAM 7.5 MG PO TABS: 7.5000 mg | ORAL_TABLET | Freq: Every day | ORAL | 1 refills | Status: DC

## 2017-02-25 MED ORDER — CYCLOBENZAPRINE HCL 5 MG PO TABS
5.0000 mg | ORAL_TABLET | Freq: Three times a day (TID) | ORAL | 1 refills | Status: DC | PRN
Start: 2017-02-25 — End: 2018-07-08

## 2017-02-25 NOTE — Patient Instructions (Addendum)
Record the blood pressure twice per week.  Ice the shoulder and neck three times per day for 15 minutes.  You may apply heat just prior to stretches.   Do not take the mobic with naproxen or ibuprofen.  You are able to take the tylenol.  You will need to return here or your pcp in 2-3 weeks.    DASH Eating Plan DASH stands for "Dietary Approaches to Stop Hypertension." The DASH eating plan is a healthy eating plan that has been shown to reduce high blood pressure (hypertension). It may also reduce your risk for type 2 diabetes, heart disease, and stroke. The DASH eating plan may also help with weight loss. What are tips for following this plan? General guidelines   Avoid eating more than 2,300 mg (milligrams) of salt (sodium) a day. If you have hypertension, you may need to reduce your sodium intake to 1,500 mg a day.  Limit alcohol intake to no more than 1 drink a day for nonpregnant women and 2 drinks a day for men. One drink equals 12 oz of beer, 5 oz of wine, or 1 oz of hard liquor.  Work with your health care provider to maintain a healthy body weight or to lose weight. Ask what an ideal weight is for you.  Get at least 30 minutes of exercise that causes your heart to beat faster (aerobic exercise) most days of the week. Activities may include walking, swimming, or biking.  Work with your health care provider or diet and nutrition specialist (dietitian) to adjust your eating plan to your individual calorie needs. Reading food labels   Check food labels for the amount of sodium per serving. Choose foods with less than 5 percent of the Daily Value of sodium. Generally, foods with less than 300 mg of sodium per serving fit into this eating plan.  To find whole grains, look for the word "whole" as the first word in the ingredient list. Shopping   Buy products labeled as "low-sodium" or "no salt added."  Buy fresh foods. Avoid canned foods and premade or frozen meals. Cooking   Avoid  adding salt when cooking. Use salt-free seasonings or herbs instead of table salt or sea salt. Check with your health care provider or pharmacist before using salt substitutes.  Do not fry foods. Cook foods using healthy methods such as baking, boiling, grilling, and broiling instead.  Cook with heart-healthy oils, such as olive, canola, soybean, or sunflower oil. Meal planning    Eat a balanced diet that includes:  5 or more servings of fruits and vegetables each day. At each meal, try to fill half of your plate with fruits and vegetables.  Up to 6-8 servings of whole grains each day.  Less than 6 oz of lean meat, poultry, or fish each day. A 3-oz serving of meat is about the same size as a deck of cards. One egg equals 1 oz.  2 servings of low-fat dairy each day.  A serving of nuts, seeds, or beans 5 times each week.  Heart-healthy fats. Healthy fats called Omega-3 fatty acids are found in foods such as flaxseeds and coldwater fish, like sardines, salmon, and mackerel.  Limit how much you eat of the following:  Canned or prepackaged foods.  Food that is high in trans fat, such as fried foods.  Food that is high in saturated fat, such as fatty meat.  Sweets, desserts, sugary drinks, and other foods with added sugar.  Full-fat dairy products.  Do not salt foods before eating.  Try to eat at least 2 vegetarian meals each week.  Eat more home-cooked food and less restaurant, buffet, and fast food.  When eating at a restaurant, ask that your food be prepared with less salt or no salt, if possible. What foods are recommended? The items listed may not be a complete list. Talk with your dietitian about what dietary choices are best for you. Grains  Whole-grain or whole-wheat bread. Whole-grain or whole-wheat pasta. Brown rice. [place]ena Morrow. Bulgur. Whole-grain and low-sodium cereals. Pita bread. Low-fat, low-sodium crackers. Whole-wheat flour tortillas. Vegetables  Fresh or  frozen vegetables (raw, steamed, roasted, or grilled). Low-sodium or reduced-sodium tomato and vegetable juice. Low-sodium or reduced-sodium tomato sauce and tomato paste. Low-sodium or reduced-sodium canned vegetables. Fruits  All fresh, dried, or frozen fruit. Canned fruit in natural juice (without added sugar). Meat and other protein foods  Skinless chicken or [place]. Ground chicken or [place]. Pork with fat trimmed off. Fish and seafood. Egg whites. Dried beans, peas, or lentils. Unsalted nuts, nut butters, and seeds. Unsalted canned beans. Lean cuts of beef with fat trimmed off. Low-sodium, lean deli meat. Dairy  Low-fat (1%) or fat-free (skim) milk. Fat-free, low-fat, or reduced-fat cheeses. Nonfat, low-sodium ricotta or cottage cheese. Low-fat or nonfat yogurt. Low-fat, low-sodium cheese. Fats and oils  Soft margarine without trans fats. Vegetable oil. Low-fat, reduced-fat, or light mayonnaise and salad dressings (reduced-sodium). Canola, safflower, olive, soybean, and sunflower oils. Avocado. Seasoning and other foods  Herbs. Spices. Seasoning mixes without salt. Unsalted popcorn and pretzels. Fat-free sweets. What foods are not recommended? The items listed may not be a complete list. Talk with your dietitian about what dietary choices are best for you. Grains  Baked goods made with fat, such as croissants, muffins, or some breads. Dry pasta or rice meal packs. Vegetables  Creamed or fried vegetables. Vegetables in a cheese sauce. Regular canned vegetables (not low-sodium or reduced-sodium). Regular canned tomato sauce and paste (not low-sodium or reduced-sodium). Regular tomato and vegetable juice (not low-sodium or reduced-sodium). Angie Fava. Olives. Fruits  Canned fruit in a light or heavy syrup. Fried fruit. Fruit in cream or butter sauce. Meat and other protein foods  Fatty cuts of meat. Ribs. Fried meat. Berniece Salines. Sausage. Bologna and other processed lunch meats. Salami. Fatback.  Hotdogs. Bratwurst. Salted nuts and seeds. Canned beans with added salt. Canned or smoked fish. Whole eggs or egg yolks. Chicken or [place] with skin. Dairy  Whole or 2% milk, cream, and half-and-half. Whole or full-fat cream cheese. Whole-fat or sweetened yogurt. Full-fat cheese. Nondairy creamers. Whipped toppings. Processed cheese and cheese spreads. Fats and oils  Butter. Stick margarine. Lard. Shortening. Ghee. Bacon fat. Tropical oils, such as coconut, palm kernel, or palm oil. Seasoning and other foods  Salted popcorn and pretzels. Onion salt, garlic salt, seasoned salt, table salt, and sea salt. Worcestershire sauce. Tartar sauce. Barbecue sauce. Teriyaki sauce. Soy sauce, including reduced-sodium. Steak sauce. Canned and packaged gravies. Fish sauce. Oyster sauce. Cocktail sauce. Horseradish that you find on the shelf. Ketchup. Mustard. Meat flavorings and tenderizers. Bouillon cubes. Hot sauce and Tabasco sauce. Premade or packaged marinades. Premade or packaged taco seasonings. Relishes. Regular salad dressings. Where to find more information:  National Heart, Lung, and [place]: https://wilson-eaton.com/  American Heart Association: www.heart.org Summary  The DASH eating plan is a healthy eating plan that has been shown to reduce high blood pressure (hypertension). It may also reduce your risk for type 2 diabetes, heart  disease, and stroke.  With the DASH eating plan, you should limit salt (sodium) intake to 2,300 mg a day. If you have hypertension, you may need to reduce your sodium intake to 1,500 mg a day.  When on the DASH eating plan, aim to eat more fresh fruits and vegetables, whole grains, lean proteins, low-fat dairy, and heart-healthy fats.  Work with your health care provider or diet and nutrition specialist (dietitian) to adjust your eating plan to your individual calorie needs. This information is not intended to replace advice given to you by your health care provider.  Make sure you discuss any questions you have with your health care provider. Document Released: 11/21/2011 Document Revised: 11/25/2016 Document Reviewed: 11/25/2016 Elsevier Interactive Patient Education  2017 Elsevier Inc.   Lisinopril tablets What is this medicine? LISINOPRIL (lyse IN oh pril) is an ACE inhibitor. This medicine is used to treat high blood pressure and heart failure. It is also used to protect the heart immediately after a heart attack. This medicine may be used for other purposes; ask your health care provider or pharmacist if you have questions. COMMON BRAND NAME(S): Prinivil, Zestril What should I tell my health care provider before I take this medicine? They need to know if you have any of these conditions: -diabetes -heart or blood vessel disease -kidney disease -low blood pressure -previous swelling of the tongue, face, or lips with difficulty breathing, difficulty swallowing, hoarseness, or tightening of the throat -an unusual or allergic reaction to lisinopril, other ACE inhibitors, insect venom, foods, dyes, or preservatives -pregnant or trying to get pregnant -breast-feeding How should I use this medicine? Take this medicine by mouth with a glass of water. Follow the directions on your prescription label. You may take this medicine with or without food. If it upsets your stomach, take it with food. Take your medicine at regular intervals. Do not take it more often than directed. Do not stop taking except on your doctor's advice. Talk to your pediatrician regarding the use of this medicine in children. Special care may be needed. While this drug may be prescribed for children as young as 65 years of age for selected conditions, precautions do apply. Overdosage: If you think you have taken too much of this medicine contact a poison control center or emergency room at once. NOTE: This medicine is only for you. Do not share this medicine with others. What if I miss a  dose? If you miss a dose, take it as soon as you can. If it is almost time for your next dose, take only that dose. Do not take double or extra doses. What may interact with this medicine? Do not take this medicine with any of the following medications: -hymenoptera venom -sacubitril; valsartan This medicines may also interact with the following medications: -aliskiren -angiotensin receptor blockers, like losartan or valsartan -certain medicines for diabetes -diuretics -everolimus -gold compounds -lithium -NSAIDs, medicines for pain and inflammation, like ibuprofen or naproxen -potassium salts or supplements -salt substitutes -sirolimus -temsirolimus This list may not describe all possible interactions. Give your health care provider a list of all the medicines, herbs, non-prescription drugs, or dietary supplements you use. Also tell them if you smoke, drink alcohol, or use illegal drugs. Some items may interact with your medicine. What should I watch for while using this medicine? Visit your doctor or health care professional for regular check ups. Check your blood pressure as directed. Ask your doctor what your blood pressure should be, and when you  should contact him or her. Do not treat yourself for coughs, colds, or pain while you are using this medicine without asking your doctor or health care professional for advice. Some ingredients may increase your blood pressure. Women should inform their doctor if they wish to become pregnant or think they might be pregnant. There is a potential for serious side effects to an unborn child. Talk to your health care professional or pharmacist for more information. Check with your doctor or health care professional if you get an attack of severe diarrhea, nausea and vomiting, or if you sweat a lot. The loss of too much body fluid can make it dangerous for you to take this medicine. You may get drowsy or dizzy. Do not drive, use machinery, or do  anything that needs mental alertness until you know how this drug affects you. Do not stand or sit up quickly, especially if you are an older patient. This reduces the risk of dizzy or fainting spells. Alcohol can make you more drowsy and dizzy. Avoid alcoholic drinks. Avoid salt substitutes unless you are told otherwise by your doctor or health care professional. What side effects may I notice from receiving this medicine? Side effects that you should report to your doctor or health care professional as soon as possible: -allergic reactions like skin rash, itching or hives, swelling of the hands, feet, face, lips, throat, or tongue -breathing problems -signs and symptoms of kidney injury like trouble passing urine or change in the amount of urine -signs and symptoms of increased potassium like muscle weakness; chest pain; or fast, irregular heartbeat -signs and symptoms of liver injury like dark yellow or brown urine; general ill feeling or flu-like symptoms; light-colored stools; loss of appetite; nausea; right upper belly pain; unusually weak or tired; yellowing of the eyes or skin -signs and symptoms of low blood pressure like dizziness; feeling faint or lightheaded, falls; unusually weak or tired -stomach pain with or without nausea and vomiting Side effects that usually do not require medical attention (report to your doctor or health care professional if they continue or are bothersome): -changes in taste -cough -dizziness -fever -headache -sensitivity to light This list may not describe all possible side effects. Call your doctor for medical advice about side effects. You may report side effects to FDA at 1-800-FDA-1088. Where should I keep my medicine? Keep out of the reach of children. Store at room temperature between 15 and 30 degrees C (59 and 86 degrees F). Protect from moisture. Keep container tightly closed. Throw away any unused medicine after the expiration date. NOTE: This  sheet is a summary. It may not cover all possible information. If you have questions about this medicine, talk to your doctor, pharmacist, or health care provider.  2018 Elsevier/Gold Standard (2016-01-22 12:52:35)     IF you received an x-ray today, you will receive an invoice from [facility]. Please contact [facility] at [phone] with questions or concerns regarding your invoice.   IF you received labwork today, you will receive an invoice from [facility]. Please contact [facility] at [phone] with questions or concerns regarding your invoice.   Our billing staff will not be able to assist you with questions regarding bills from these companies.  You will be contacted with the lab results as soon as they are available. The fastest way to get your results is to activate your My Chart account. Instructions are located on the last page of this paperwork. If you have not heard from [place]  regarding the results in 2 weeks, please contact this office.

## 2017-02-25 NOTE — Progress Notes (Signed)
PRIMARY CARE AT [place], [place] [address] [phone]  Date:  02/25/2017   Name:  SEVEN MARENGO   DOB:  03/09/1951   MRN:  [id number]  PCP:  Criselda Peaches, MD    History of Present Illness:  Kimberly Tran is a 66 y.o. female patient who presents to PCP with  Chief Complaint  Patient presents with  . Follow-up    bp check     3 years of left shoulder pain.  Sunday night, she turned her head to the left, and the pain started around her right side of neck and shoulder.  Aleve will improve pain, but did not get any better.  No numbness or tingling down the right arm.  Shoots down upper shoulder to the back.  No swelling or redness.  She has to sleep sitting up due to the pain.  6.5-7/10 pain in shoulder, and at times 11/10.     There are no active problems to display for this patient.   Past Medical History:  Diagnosis Date  . Cataract   . Thyroid disease     Past Surgical History:  Procedure Laterality Date  . HERNIA REPAIR      Social History  Substance Use Topics  . Smoking status: Never Smoker  . Smokeless tobacco: Never Used  . Alcohol use No    Family History  Problem Relation Age of Onset  . Cancer Mother     Lung  . Diabetes Father   . Heart disease Father   . Hypertension Brother     No Known Allergies  Medication list has been reviewed and updated.  Current Outpatient Prescriptions on File Prior to Visit  Medication Sig Dispense Refill  . albuterol (PROVENTIL HFA;VENTOLIN HFA) 108 (90 Base) MCG/ACT inhaler Inhale 2 puffs into the lungs every 8 (eight) hours as needed for wheezing or shortness of breath (cough, shortness of breath or wheezing.). 1 Inhaler 1  . BIOTIN PO Take 1 capsule by mouth daily.    Marland Kitchen CALCIUM PO Take 1 tablet by mouth daily.    . cholecalciferol (VITAMIN D) 1000 UNITS tablet Take 1,000 Units by mouth daily.    Marland Kitchen doxycycline (VIBRAMYCIN) 50 MG capsule Take 50 mg by mouth daily as needed. For rosacea    .  estradiol (ESTRACE) 0.5 MG tablet Take 0.5 mg by mouth daily.    Marland Kitchen levothyroxine (SYNTHROID, LEVOTHROID) 50 MCG tablet Take 50 mcg by mouth daily. as directed  0  . progesterone (PROMETRIUM) 100 MG capsule Take 100 mg by mouth daily.    . valACYclovir (VALTREX) 500 MG tablet Take 500 mg by mouth daily as needed. For outbreak     No current facility-administered medications on file prior to visit.     ROS ROS otherwise unremarkable unless listed above.  Physical Examination: BP (!) 162/82   Pulse 76   Temp 97.9 F (36.6 C) (Oral)   Resp 16   Ht 5' 5.5" (1.664 m)   Wt 141 lb (64 kg)   SpO2 99%   BMI 23.11 kg/m  Ideal Body Weight: Weight in (lb) to have BMI = 25: 152.2  Physical Exam  Constitutional: She is oriented to person, place, and time. She appears well-developed and well-nourished. No distress.  HENT:  Head: Normocephalic and atraumatic.  Right Ear: External ear normal.  Left Ear: External ear normal.  Eyes: Conjunctivae and EOM are normal. Pupils are equal, round, and reactive to light.  Cardiovascular:  Normal rate and regular rhythm.   Pulses:      Carotid pulses are 2+ on the right side, and 2+ on the left side.      Radial pulses are 2+ on the right side, and 2+ on the left side.  Pulmonary/Chest: Effort normal. No apnea. No respiratory distress. She has no decreased breath sounds. She has no wheezes. She has no rhonchi.  Musculoskeletal:       Right shoulder: She exhibits normal range of motion and no bony tenderness.  Right trapezius musculature tight and hard compared to the other side  Neurological: She is alert and oriented to person, place, and time.  Skin: She is not diaphoretic.  Psychiatric: She has a normal mood and affect. Her behavior is normal.     Assessment and Plan: EVAGELIA KNACK is a 66 y.o. female who is here today for bp recheck. --bp normal.  Advised muscle relaxant.   --started lisinopril 10mg  qd.  Record bp twice per week --rtc or to  pcp. Essential hypertension - Plan: Basic metabolic panel  Cervical paraspinal muscle spasm - Plan: cyclobenzaprine (FLEXERIL) 5 MG tablet  Ivar Drape, PA-C [facility] 3/16/20183:25 PM

## 2017-02-26 LAB — BASIC METABOLIC PANEL
BUN/Creatinine Ratio: 13 (ref 12–28)
BUN: 10 mg/dL (ref 8–27)
CALCIUM: 9 mg/dL (ref 8.7–10.3)
CO2: 27 mmol/L (ref 18–29)
CREATININE: 0.79 mg/dL (ref 0.57–1.00)
Chloride: 100 mmol/L (ref 96–106)
GFR calc Af Amer: 91 mL/min/{1.73_m2} (ref >59)
GFR, EST NON AFRICAN AMERICAN: 79 mL/min/{1.73_m2} (ref >59)
Glucose: 103 mg/dL — ABNORMAL HIGH (ref 65–99)
Potassium: 4.5 mmol/L (ref 3.5–5.2)
Sodium: 139 mmol/L (ref 134–144)

## 2017-02-28 ENCOUNTER — Encounter: Payer: Self-pay | Admitting: Physician Assistant

## 2017-02-28 MED ORDER — LISINOPRIL 10 MG PO TABS: 10.0000 mg | ORAL_TABLET | Freq: Every day | ORAL | 1 refills | Status: DC

## 2017-03-04 ENCOUNTER — Telehealth: Payer: Self-pay | Admitting: Emergency Medicine

## 2017-03-04 NOTE — Telephone Encounter (Signed)
-----   Message from Joretta Bachelor, [place] sent at 03/03/2017  8:14 AM EDT ----- Electrolytes are normal, and kidney function appears normal.  Let's follow up as we discussed, in 2 weeks after starting bp med

## 2017-03-19 ENCOUNTER — Ambulatory Visit: Payer: Medicare HMO | Admitting: Physician Assistant

## 2017-07-31 ENCOUNTER — Encounter: Payer: Self-pay | Admitting: Obstetrics and Gynecology

## 2017-07-31 ENCOUNTER — Other Ambulatory Visit: Payer: Self-pay | Admitting: Obstetrics and Gynecology

## 2017-07-31 DIAGNOSIS — Z1231 Encounter for screening mammogram for malignant neoplasm of breast: Secondary | ICD-10-CM

## 2017-09-01 ENCOUNTER — Ambulatory Visit
Admission: RE | Admit: 2017-09-01 | Discharge: 2017-09-01 | Disposition: A | Discharge status: Home / Self Care | Payer: Medicare HMO | Source: Ambulatory Visit | Attending: Obstetrics and Gynecology | Admitting: Obstetrics and Gynecology

## 2017-09-01 DIAGNOSIS — Z1231 Encounter for screening mammogram for malignant neoplasm of breast: Secondary | ICD-10-CM

## 2017-10-16 ENCOUNTER — Encounter: Payer: Self-pay | Admitting: Gastroenterology

## 2018-03-18 ENCOUNTER — Encounter: Payer: Self-pay | Admitting: Physician Assistant

## 2018-05-15 ENCOUNTER — Other Ambulatory Visit: Payer: Self-pay | Admitting: Obstetrics and Gynecology

## 2018-05-15 DIAGNOSIS — E041 Nontoxic single thyroid nodule: Secondary | ICD-10-CM

## 2018-05-20 ENCOUNTER — Encounter: Payer: Self-pay | Admitting: Gastroenterology

## 2018-05-21 ENCOUNTER — Other Ambulatory Visit: Payer: Medicare HMO

## 2018-05-26 ENCOUNTER — Ambulatory Visit
Admission: RE | Admit: 2018-05-26 | Discharge: 2018-05-26 | Disposition: A | Discharge status: Home / Self Care | Payer: Medicare HMO | Source: Ambulatory Visit | Attending: Obstetrics and Gynecology | Admitting: Obstetrics and Gynecology

## 2018-05-26 DIAGNOSIS — E041 Nontoxic single thyroid nodule: Secondary | ICD-10-CM

## 2018-07-08 ENCOUNTER — Ambulatory Visit (AMBULATORY_SURGERY_CENTER): Payer: Self-pay

## 2018-07-08 ENCOUNTER — Other Ambulatory Visit: Payer: Self-pay

## 2018-07-08 VITALS — Ht 65.5 in | Wt 143.8 lb

## 2018-07-08 DIAGNOSIS — Z1211 Encounter for screening for malignant neoplasm of colon: Secondary | ICD-10-CM

## 2018-07-08 MED ORDER — NA SULFATE-K SULFATE-MG SULF 17.5-3.13-1.6 GM/177ML PO SOLN: 1.0000 | Freq: Once | ORAL | 0 refills | Status: AC

## 2018-07-08 NOTE — Progress Notes (Signed)
No egg or soy allergy known to patient  No issues with past sedation with any surgeries  or procedures, no intubation problems  No diet pills per patient No home 02 use per patient  No blood thinners per patient  Pt denies issues with constipation  No A fib or A flutter  EMMI video sent to pt's e mail  

## 2018-07-09 ENCOUNTER — Encounter: Payer: Self-pay | Admitting: Gastroenterology

## 2018-07-15 ENCOUNTER — Telehealth: Payer: Self-pay | Admitting: Gastroenterology

## 2018-07-15 DIAGNOSIS — Z1211 Encounter for screening for malignant neoplasm of colon: Secondary | ICD-10-CM

## 2018-07-15 MED ORDER — NA SULFATE-K SULFATE-MG SULF 17.5-3.13-1.6 GM/177ML PO SOLN: 1.0000 | Freq: Once | ORAL | 0 refills | Status: AC

## 2018-07-15 NOTE — Telephone Encounter (Signed)
Patient notified that the suprep was sent to [facility] in [place].

## 2018-07-21 ENCOUNTER — Ambulatory Visit (AMBULATORY_SURGERY_CENTER): Payer: Medicare HMO | Admitting: Gastroenterology

## 2018-07-21 ENCOUNTER — Encounter: Payer: Self-pay | Admitting: Gastroenterology

## 2018-07-21 VITALS — BP 122/78 | HR 59 | Temp 98.9°F | Resp 11 | Ht 65.0 in | Wt 141.0 lb

## 2018-07-21 DIAGNOSIS — Z1211 Encounter for screening for malignant neoplasm of colon: Secondary | ICD-10-CM | POA: Diagnosis not present

## 2018-07-21 MED ORDER — SODIUM CHLORIDE 0.9 % IV SOLN: 500.0000 mL | Freq: Once | INTRAVENOUS | Status: DC

## 2018-07-21 NOTE — Op Note (Addendum)
[facility] Patient Name: Kimberly Tran Procedure Date: 07/21/2018 1:28 PM MRN: [id number] Endoscopist: Mallie Mussel L. Loletha Carrow , MD Age: 67 Referring MD:  Date of Birth: 06-Jul-1951 Gender: Female Account #: [id number] Procedure:                Colonoscopy Indications:              Screening for colorectal malignant neoplasm (no                            polyps 09/2007) Medicines:                Monitored Anesthesia Care Procedure:                Pre-Anesthesia Assessment:                           - Prior to the procedure, a History and Physical                            was performed, and patient medications and                            allergies were reviewed. The patient's tolerance of                            previous anesthesia was also reviewed. The risks                            and benefits of the procedure and the sedation                            options and risks were discussed with the patient.                            All questions were answered, and informed consent                            was obtained. Prior Anticoagulants: The patient has                            taken no previous anticoagulant or antiplatelet                            agents. ASA Grade Assessment: II - A patient with                            mild systemic disease. After reviewing the risks                            and benefits, the patient was deemed in                            satisfactory condition to undergo the procedure.  After obtaining informed consent, the colonoscope                            was passed under direct vision. Throughout the                            procedure, the patient's blood pressure, pulse, and                            oxygen saturations were monitored continuously. The                            Colonoscope was introduced through the anus and                            advanced to the the cecum, identified by                             appendiceal orifice and ileocecal valve. The                            colonoscopy was performed without difficulty. The                            patient tolerated the procedure well. The quality                            of the bowel preparation was excellent. The                            ileocecal valve, appendiceal orifice, and rectum                            were photographed. Scope In: 1:35:23 PM Scope Out: 1:48:30 PM Scope Withdrawal Time: 0 hours 9 minutes 36 seconds  Total Procedure Duration: 0 hours 13 minutes 7 seconds  Findings:                 The perianal and digital rectal examinations were                            normal.                           Multiple medium-mouthed diverticula were found in                            the left colon.                           The exam was otherwise without abnormality on                            direct and retroflexion views. Complications:            No immediate complications. Estimated Blood Loss:  Estimated blood loss: none. Impression:               - Diverticulosis in the left colon.                           - The examination was otherwise normal on direct                            and retroflexion views.                           - No specimens collected. Recommendation:           - Patient has a contact number available for                            emergencies. The signs and symptoms of potential                            delayed complications were discussed with the                            patient. Return to normal activities tomorrow.                            Written discharge instructions were provided to the                            patient.                           - Resume previous diet.                           - Continue present medications.                           - Repeat colonoscopy in 10 years for screening                            purposes. Rosalinda Seaman L. Loletha Carrow,  MD 07/21/2018 1:56:49 PM This report has been signed electronically.

## 2018-07-21 NOTE — Progress Notes (Signed)
Pt's states no medical or surgical changes since previsit or office visit. 

## 2018-07-21 NOTE — Patient Instructions (Signed)
Discharge instructions given. Handout on diverticulosis. Resume previous medications. YOU HAD AN ENDOSCOPIC PROCEDURE TODAY AT [facility]:   Refer to the procedure report that was given to you for any specific questions about what was found during the examination.  If the procedure report does not answer your questions, please call your gastroenterologist to clarify.  If you requested that your care partner not be given the details of your procedure findings, then the procedure report has been included in a sealed envelope for you to review at your convenience later.  YOU SHOULD EXPECT: Some feelings of bloating in the abdomen. Passage of more gas than usual.  Walking can help get rid of the air that was put into your GI tract during the procedure and reduce the bloating. If you had a lower endoscopy (such as a colonoscopy or flexible sigmoidoscopy) you may notice spotting of blood in your stool or on the toilet paper. If you underwent a bowel prep for your procedure, you may not have a normal bowel movement for a few days.  Please Note:  You might notice some irritation and congestion in your nose or some drainage.  This is from the oxygen used during your procedure.  There is no need for concern and it should clear up in a day or so.  SYMPTOMS TO REPORT IMMEDIATELY:   Following lower endoscopy (colonoscopy or flexible sigmoidoscopy):  Excessive amounts of blood in the stool  Significant tenderness or worsening of abdominal pains  Swelling of the abdomen that is new, acute  Fever of 100F or higher   For urgent or emergent issues, a gastroenterologist can be reached at any hour by calling [phone].   DIET:  We do recommend a small meal at first, but then you may proceed to your regular diet.  Drink plenty of fluids but you should avoid alcoholic beverages for 24 hours.  ACTIVITY:  You should plan to take it easy for the rest of today and you should NOT DRIVE or use  heavy machinery until tomorrow (because of the sedation medicines used during the test).    FOLLOW UP: Our staff will call the number listed on your records the next business day following your procedure to check on you and address any questions or concerns that you may have regarding the information given to you following your procedure. If we do not reach you, we will leave a message.  However, if you are feeling well and you are not experiencing any problems, there is no need to return our call.  We will assume that you have returned to your regular daily activities without incident.  If any biopsies were taken you will be contacted by phone or by letter within the next 1-3 weeks.  Please call us at [phone] if you have not heard about the biopsies in 3 weeks.    SIGNATURES/CONFIDENTIALITY: You and/or your care partner have signed paperwork which will be entered into your electronic medical record.  These signatures attest to the fact that that the information above on your After Visit Summary has been reviewed and is understood.  Full responsibility of the confidentiality of this discharge information lies with you and/or your care-partner.

## 2018-07-21 NOTE — Progress Notes (Signed)
Report given to PACU, vss 

## 2018-07-22 ENCOUNTER — Telehealth: Payer: Self-pay | Admitting: *Deleted

## 2018-07-22 NOTE — Telephone Encounter (Signed)
  Follow up Call-  Call back number 07/21/2018  Post procedure Call Back phone  # [phone]  Permission to leave phone message Yes  Some recent data might be hidden     Patient questions:  Do you have a fever, pain , or abdominal swelling? No. Pain Score  0 *  Have you tolerated food without any problems? Yes.    Have you been able to return to your normal activities? Yes.    Do you have any questions about your discharge instructions: Diet   No. Medications  No. Follow up visit  No.  Do you have questions or concerns about your Care? No.  Actions: * If pain score is 4 or above: No action needed, pain <4.

## 2018-07-23 ENCOUNTER — Other Ambulatory Visit: Payer: Self-pay | Admitting: Obstetrics and Gynecology

## 2018-07-23 DIAGNOSIS — Z1231 Encounter for screening mammogram for malignant neoplasm of breast: Secondary | ICD-10-CM

## 2018-09-16 ENCOUNTER — Ambulatory Visit
Admission: RE | Admit: 2018-09-16 | Discharge: 2018-09-16 | Disposition: A | Discharge status: Home / Self Care | Payer: Medicare HMO | Source: Ambulatory Visit | Attending: Obstetrics and Gynecology | Admitting: Obstetrics and Gynecology

## 2018-09-16 DIAGNOSIS — Z1231 Encounter for screening mammogram for malignant neoplasm of breast: Secondary | ICD-10-CM

## 2018-11-06 ENCOUNTER — Ambulatory Visit
Admission: RE | Admit: 2018-11-06 | Discharge: 2018-11-06 | Disposition: A | Discharge status: Home / Self Care | Payer: Medicare HMO | Source: Ambulatory Visit | Attending: Internal Medicine | Admitting: Internal Medicine

## 2018-11-06 ENCOUNTER — Other Ambulatory Visit: Payer: Self-pay | Admitting: Internal Medicine

## 2018-11-06 DIAGNOSIS — G8929 Other chronic pain: Secondary | ICD-10-CM

## 2019-05-25 ENCOUNTER — Other Ambulatory Visit: Payer: Self-pay | Admitting: Obstetrics and Gynecology

## 2019-05-25 DIAGNOSIS — E041 Nontoxic single thyroid nodule: Secondary | ICD-10-CM

## 2019-06-09 ENCOUNTER — Ambulatory Visit
Admission: RE | Admit: 2019-06-09 | Discharge: 2019-06-09 | Disposition: A | Discharge status: Home / Self Care | Payer: Medicare HMO | Source: Ambulatory Visit | Attending: Obstetrics and Gynecology | Admitting: Obstetrics and Gynecology

## 2019-06-09 DIAGNOSIS — E041 Nontoxic single thyroid nodule: Secondary | ICD-10-CM

## 2019-08-18 ENCOUNTER — Other Ambulatory Visit: Payer: Self-pay | Admitting: Obstetrics and Gynecology

## 2019-08-18 DIAGNOSIS — Z1231 Encounter for screening mammogram for malignant neoplasm of breast: Secondary | ICD-10-CM

## 2019-09-27 ENCOUNTER — Other Ambulatory Visit: Payer: Self-pay

## 2019-09-27 ENCOUNTER — Ambulatory Visit
Admission: RE | Admit: 2019-09-27 | Discharge: 2019-09-27 | Disposition: A | Discharge status: Home / Self Care | Payer: Medicare HMO | Source: Ambulatory Visit | Attending: Obstetrics and Gynecology | Admitting: Obstetrics and Gynecology

## 2019-09-27 DIAGNOSIS — Z1231 Encounter for screening mammogram for malignant neoplasm of breast: Secondary | ICD-10-CM

## 2019-11-24 ENCOUNTER — Other Ambulatory Visit: Payer: Self-pay

## 2019-11-24 ENCOUNTER — Encounter: Payer: Self-pay | Admitting: Family

## 2019-11-24 ENCOUNTER — Ambulatory Visit (INDEPENDENT_AMBULATORY_CARE_PROVIDER_SITE_OTHER): Payer: Medicare HMO | Admitting: Family

## 2019-11-24 VITALS — BP 128/78 | HR 72 | Temp 98.0°F | Ht 65.0 in | Wt 149.2 lb

## 2019-11-24 DIAGNOSIS — Z23 Encounter for immunization: Secondary | ICD-10-CM | POA: Diagnosis not present

## 2019-11-24 DIAGNOSIS — B009 Herpesviral infection, unspecified: Secondary | ICD-10-CM | POA: Diagnosis not present

## 2019-11-24 DIAGNOSIS — Z8619 Personal history of other infectious and parasitic diseases: Secondary | ICD-10-CM

## 2019-11-24 DIAGNOSIS — L719 Rosacea, unspecified: Secondary | ICD-10-CM

## 2019-11-24 DIAGNOSIS — L309 Dermatitis, unspecified: Secondary | ICD-10-CM

## 2019-11-24 DIAGNOSIS — E039 Hypothyroidism, unspecified: Secondary | ICD-10-CM | POA: Diagnosis not present

## 2019-11-24 NOTE — Patient Instructions (Signed)
Please get [place] a copy of your last CPE and labs from Dr. Nyoka Cowden; we can plan to see you in July 2020;

## 2019-11-24 NOTE — Progress Notes (Signed)
Kimberly Tran is a 68 y.o. female with the following history as recorded in EpicCare:  There are no active problems to display for this patient.   Current Outpatient Medications  Medication Sig Dispense Refill  . betamethasone valerate ointment (VALISONE) 0.1 % APPLY AS DIRECTED TWICE DAILY  6  . BIOTIN PO Take 1 capsule by mouth daily.    . Calcium Citrate-Vitamin D (CALCITRATE/VITAMIN D PO) Take by mouth.    . fluocinonide ointment (LIDEX) 0.05 % See admin instructions.    Marland Kitchen levothyroxine (SYNTHROID, LEVOTHROID) 50 MCG tablet Take 50 mcg by mouth daily. as directed  0  . MAGNESIUM ASPARTATE PO Take by mouth.    . TURMERIC PO Take by mouth.    . doxycycline (VIBRAMYCIN) 50 MG capsule Take 50 mg by mouth daily as needed. For rosacea    . valACYclovir (VALTREX) 500 MG tablet Take 500 mg by mouth daily as needed. For outbreak     No current facility-administered medications for this visit.     Allergies: Patient has no known allergies.  Past Medical History:  Diagnosis Date  . Arthritis    fingers  . Cataract   . Hyperlipidemia   . Thyroid disease     Past Surgical History:  Procedure Laterality Date  . COLONOSCOPY    . HERNIA REPAIR      Family History  Problem Relation Age of Onset  . Lung cancer Mother   . Diabetes Father   . Heart disease Father   . Hypertension Brother   . Colon cancer Neg Hx   . Colon polyps Neg Hx   . Esophageal cancer Neg Hx   . Rectal cancer Neg Hx   . Stomach cancer Neg Hx     Social History   Tobacco Use  . Smoking status: Never Smoker  . Smokeless tobacco: Never Used  Substance Use Topics  . Alcohol use: No    Subjective:  Presents as a new patient today; her PCP is retiring and needs to establish new care;  1) History of hypothyroidism/ thyroid nodules; Takes Synthroid 1 per day M-F and 2/ day on Saturday/ Sunday; had thyroid ultrasound in June 2020; 2) Under care of dermatology for eczema- Dr. 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  Head: Normocephalic and atraumatic  Lungs: Respirations unlabored; clear to auscultation bilaterally without wheeze, rales, rhonchi  CVS exam: normal rate and regular rhythm.  Neurologic: Alert and oriented; speech intact; face symmetrical; moves all extremities well; CNII-XII intact without focal deficit    Assessment:  1. Hypothyroidism, unspecified type   2. Eczema, unspecified type   3. Herpes simplex type 2 infection   4. Rosacea   5. History of shingles     Plan:  1. Reviewed recent U/S; will plan to check labs in July 2021; 2. Continue with dermatology; 3. Stable; very rare need for Valtrex; 4. Stable- managed with Doxycycline from ophthalmology; 5. Will defer Shingrix vaccine due to concerns for possible allergic reaction;  Prevnar given today; plan for Pneumovax 23 next year;    This visit occurred during the SARS-CoV-2  public health emergency.  Safety protocols were in place, including screening questions prior to the visit, additional usage of staff PPE, and extensive cleaning of exam room while observing appropriate contact time as indicated for disinfecting solutions.     No follow-ups on file.  Orders Placed This Encounter  Procedures  . Pneumococcal conjugate vaccine 13-valent    Requested Prescriptions    No prescriptions requested or ordered in this encounter

## 2020-04-26 ENCOUNTER — Ambulatory Visit: Payer: Medicare HMO | Admitting: Dermatology

## 2020-05-03 ENCOUNTER — Other Ambulatory Visit: Payer: Self-pay

## 2020-05-03 ENCOUNTER — Other Ambulatory Visit: Payer: Self-pay | Admitting: Dermatology

## 2020-05-03 ENCOUNTER — Ambulatory Visit: Payer: Medicare HMO | Admitting: Dermatology

## 2020-05-03 DIAGNOSIS — D485 Neoplasm of uncertain behavior of skin: Secondary | ICD-10-CM | POA: Diagnosis not present

## 2020-05-03 DIAGNOSIS — Z86018 Personal history of other benign neoplasm: Secondary | ICD-10-CM | POA: Diagnosis not present

## 2020-05-03 DIAGNOSIS — L219 Seborrheic dermatitis, unspecified: Secondary | ICD-10-CM

## 2020-05-03 DIAGNOSIS — L820 Inflamed seborrheic keratosis: Secondary | ICD-10-CM | POA: Diagnosis not present

## 2020-05-03 DIAGNOSIS — C4492 Squamous cell carcinoma of skin, unspecified: Secondary | ICD-10-CM

## 2020-05-03 HISTORY — DX: Squamous cell carcinoma of skin, unspecified: C44.92

## 2020-05-03 MED ORDER — FLUOCINONIDE 0.05 % EX OINT: TOPICAL_OINTMENT | Freq: Two times a day (BID) | CUTANEOUS | 3 refills | Status: DC

## 2020-05-03 NOTE — Patient Instructions (Signed)

## 2020-05-07 ENCOUNTER — Encounter: Payer: Self-pay | Admitting: Dermatology

## 2020-05-07 NOTE — Progress Notes (Addendum)
   Follow-Up Visit   Subjective  Kimberly Tran is a 69 y.o. female who presents for the following: Skin Problem (Check some inflamed keratosis on legs, have grown alot.  Also patient wants [place] to check back and sholders keratosis and freeze them. Check scalp she has some itchy spots. patient also wants a refill on fluocinonide ointment for her hands they break out in eczema sometimes. ).  New growths Location: Upper legs Duration:  Quality:  Associated Signs/Symptoms: Modifying Factors:  Severity:  Timing: Context: History of atypical moles  The following portions of the chart were reviewed this encounter and updated as appropriate: Tobacco  Allergies  Meds  Problems  Med Hx  Surg Hx  Fam Hx      Objective  Well appearing patient in no apparent distress; mood and affect are within normal limits.  All sun exposed areas plus back examined.Plus scalp, legs.   Assessment & Plan  Neoplasm of uncertain behavior of skin (2) Left Thigh - Anterior  Skin / nail biopsy Type of biopsy: tangential   Informed consent: discussed and consent obtained   Timeout: patient name, date of birth, surgical site, and procedure verified   Procedure prep:  Patient was prepped and draped in usual sterile fashion Prep type:  Chlorhexidine Anesthesia: the lesion was anesthetized in a standard fashion   Anesthetic:  1% lidocaine w/ epinephrine 1-100,000 local infiltration Instrument used: flexible razor blade   Hemostasis achieved with: ferric subsulfate   Outcome: patient tolerated procedure well   Post-procedure details: wound care instructions given    Specimen 1 - Surgical pathology Differential Diagnosis: scc vs bcc Check Margins: No  Left Thigh - Posterior  Skin / nail biopsy Type of biopsy: tangential   Informed consent: discussed and consent obtained   Timeout: patient name, date of birth, surgical site, and procedure verified   Procedure prep:  Patient was prepped and draped in  usual sterile fashion Prep type:  Chlorhexidine Anesthesia: the lesion was anesthetized in a standard fashion   Anesthetic:  1% lidocaine w/ epinephrine 1-100,000 local infiltration Instrument used: flexible razor blade   Hemostasis achieved with: ferric subsulfate   Outcome: patient tolerated procedure well   Post-procedure details: wound care instructions given    Specimen 2 - Surgical pathology Differential Diagnosis: scc vs bcc Check Margins: No  Seborrheic dermatitis Scalp  Okay to refill fluocinonide solution which historically helps her  Inflamed seborrheic keratosis (2) Left Upper Back; Right Upper Back  5-second liquid nitrogen freeze at patient's request  Other Related Procedures Destruction of lesion Complexity: simple   Destruction method: cryotherapy   Informed consent: discussed and consent obtained   Timeout:  patient name, date of birth, surgical site, and procedure verified Lesion destroyed using liquid nitrogen: Yes   Region frozen until ice ball extended beyond lesion: Yes   Cryotherapy cycles:  3 Outcome: patient tolerated procedure well with no complications

## 2020-05-08 ENCOUNTER — Telehealth: Payer: Self-pay

## 2020-05-08 NOTE — Telephone Encounter (Signed)
-----   Message from Lavonna Monarch, MD sent at 05/05/2020  7:43 AM EDT ----- Please inform pt #2 (back thigh) pathologist favors low risk non-mole cancer and DrT wants to do curettage.

## 2020-05-08 NOTE — Telephone Encounter (Signed)
Phone call to patient with her pathology results. Patient aware of results.  

## 2020-05-31 ENCOUNTER — Encounter: Payer: Self-pay | Admitting: *Deleted

## 2020-06-01 ENCOUNTER — Other Ambulatory Visit: Payer: Self-pay

## 2020-06-01 ENCOUNTER — Encounter: Payer: Self-pay | Admitting: Dermatology

## 2020-06-01 ENCOUNTER — Ambulatory Visit (INDEPENDENT_AMBULATORY_CARE_PROVIDER_SITE_OTHER): Payer: Medicare HMO | Admitting: Dermatology

## 2020-06-01 DIAGNOSIS — C44729 Squamous cell carcinoma of skin of left lower limb, including hip: Secondary | ICD-10-CM

## 2020-06-01 DIAGNOSIS — C4492 Squamous cell carcinoma of skin, unspecified: Secondary | ICD-10-CM

## 2020-06-01 NOTE — Progress Notes (Signed)
Atypical Squamous Proliferation Left thigh posterior. Debrided and cx3, cautery 83fu. 1.0cm

## 2020-06-01 NOTE — Patient Instructions (Signed)

## 2020-07-03 ENCOUNTER — Encounter: Payer: Self-pay | Admitting: Dermatology

## 2020-07-03 NOTE — Progress Notes (Addendum)
   Follow-Up Visit   Subjective  Kimberly Tran is a 69 y.o. female who presents for the following: Procedure (Here for surgery Atypical Squamous Proliferation left thigh).  SCCA Location: Left thigh Duration:  Quality:  Associated Signs/Symptoms: Modifying Factors:  Severity:  Timing: Context: For treatment  Objective  Well appearing patient in no apparent distress; mood and affect are within normal limits.  A focused examination was performed including Head, neck, arms, legs.. Relevant physical exam findings are noted in the Assessment and Plan.   Assessment & Plan   Patient Instructions  Biopsy, Surgery (Curettage) & Surgery (Excision) Aftercare Instructions  1. Okay to remove bandage in 24 hours  2. Wash area with soap and water  3. Apply Vaseline to area twice daily until healed (Not Neosporin)  4. Okay to cover with a Band-Aid to decrease the chance of infection or prevent irritation from clothing; also it's okay to uncover lesion at home.  5. Suture instructions: return to our office in 7-10 or 10-14 days for a nurse visit for suture removal. Variable healing with sutures, if pain or itching occurs call our office. It's okay to shower or bathe 24 hours after sutures are given.  6. The following risks may occur after a biopsy, curettage or excision: bleeding, scarring, discoloration, recurrence, infection (redness, yellow drainage, pain or swelling).  7. For questions, concerns and results call our office at [facility] before 4pm & Friday before 3pm. Biopsy results will be available in 1 week.     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  Destruction of lesion Complexity: simple   Destruction method: electrodesiccation and curettage   Informed consent: discussed and consent obtained   Timeout:  patient name, date of birth, surgical site, and procedure verified Anesthesia: the lesion was anesthetized in a standard fashion   Anesthetic:  1%  lidocaine w/ epinephrine 1-100,000 local infiltration Curettage performed in three different directions: Yes   Curettage cycles:  3 Lesion length (cm):  1.1 Lesion width (cm):  1 Hemostasis achieved with:  ferric subsulfate Outcome: patient tolerated procedure well with no complications   Post-procedure details: wound care instructions given   Additional details:  Inoculated with parenteral 5% fluorouracil     I, Lavonna Monarch, MD, have reviewed all documentation for this visit.  The documentation on 07/06/20 for the exam, diagnosis, procedures, and orders are all accurate and complete.

## 2020-07-14 ENCOUNTER — Telehealth: Payer: Self-pay | Admitting: Emergency Medicine

## 2020-07-14 NOTE — Telephone Encounter (Signed)
Pt dropped of folder of records. Pt has been called and made aware records we not needed so she can come back by and pick them up. Placed in cabinet upfront for pick up.

## 2020-07-17 ENCOUNTER — Encounter: Payer: Medicare HMO | Admitting: Family

## 2020-07-24 ENCOUNTER — Encounter: Payer: Self-pay | Admitting: Family

## 2020-07-24 ENCOUNTER — Other Ambulatory Visit: Payer: Self-pay

## 2020-07-24 ENCOUNTER — Ambulatory Visit (INDEPENDENT_AMBULATORY_CARE_PROVIDER_SITE_OTHER): Payer: Medicare HMO | Admitting: Family

## 2020-07-24 VITALS — BP 124/70 | HR 73 | Temp 98.1°F | Ht 65.0 in | Wt 147.0 lb

## 2020-07-24 DIAGNOSIS — Z0000 Encounter for general adult medical examination without abnormal findings: Secondary | ICD-10-CM

## 2020-07-24 DIAGNOSIS — Z13220 Encounter for screening for lipoid disorders: Secondary | ICD-10-CM

## 2020-07-24 DIAGNOSIS — E039 Hypothyroidism, unspecified: Secondary | ICD-10-CM

## 2020-07-24 MED ORDER — SYNTHROID 50 MCG PO TABS: 50.0000 ug | ORAL_TABLET | Freq: Every day | ORAL | 3 refills | Status: DC

## 2020-07-24 NOTE — Addendum Note (Signed)
Addended by: Steward Ros on: 07/24/2020 09:40 AM   Modules accepted: Orders

## 2020-07-24 NOTE — Progress Notes (Signed)
Kimberly Tran is a 69 y.o. female with the following history as recorded in EpicCare:  There are no problems to display for this patient.   Current Outpatient Medications  Medication Sig Dispense Refill  . betamethasone valerate ointment (VALISONE) 0.1 % APPLY AS DIRECTED TWICE DAILY  6  . BIOTIN PO Take 1 capsule by mouth daily.    . Calcium Citrate-Vitamin D (CALCITRATE/VITAMIN D PO) Take by mouth.    . fluocinonide ointment (LIDEX) 0.05 % Apply topically 2 (two) times daily. 60 g 3  . MAGNESIUM ASPARTATE PO Take by mouth.    . neomycin-polymyxin b-dexamethasone (MAXITROL) 3.5-10000-0.1 OINT     . TURMERIC PO Take by mouth.    . doxycycline (VIBRA-TABS) 100 MG tablet Take 100 mg by mouth 2 (two) times daily. (Patient not taking: Reported on 07/24/2020)    . doxycycline (VIBRAMYCIN) 50 MG capsule Take 50 mg by mouth daily as needed. For rosacea (Patient not taking: Reported on 07/24/2020)    . SYNTHROID 50 MCG tablet Take 1 tablet (50 mcg total) by mouth daily before breakfast. 90 tablet 3  . valACYclovir (VALTREX) 1000 MG tablet Take 1,000 mg by mouth daily. (Patient not taking: Reported on 07/24/2020)    . valACYclovir (VALTREX) 500 MG tablet Take 500 mg by mouth daily as needed. For outbreak (Patient not taking: Reported on 07/24/2020)     No current facility-administered medications for this visit.    Allergies: Patient has no known allergies.  Past Medical History:  Diagnosis Date  . Arthritis    fingers  . Atypical mole 01/30/2005   slight atypia on abdomen  . Atypical mole 10/21/2006   slight to moderate atypia on left calf  . Atypical mole 02/15/2008   slight to moderate atypia on right anterior thigh  . Atypical mole 11/02/2008   slight atypia on right buttock  . Cataract   . Hyperlipidemia   . Squamous cell carcinoma of skin 05/03/2020   Atypical squamous proliferation-left thigh posterior- cx3, cautery 10f  . Thyroid disease     Past Surgical History:  Procedure  Laterality Date  . COLONOSCOPY    . HERNIA REPAIR      Family History  Problem Relation Age of Onset  . Lung cancer Mother   . Diabetes Father   . Heart disease Father   . Hypertension Brother   . Colon cancer Neg Hx   . Colon polyps Neg Hx   . Esophageal cancer Neg Hx   . Rectal cancer Neg Hx   . Stomach cancer Neg Hx     Social History   Tobacco Use  . Smoking status: Never Smoker  . Smokeless tobacco: Never Used  Substance Use Topics  . Alcohol use: No    Subjective:  Patient presents for yearly CPE; in baseline state of health today; does have GYN, dermatology; Planning to get Tdap through her pharmacist; will not be getting the COVID vaccine- had the virus in January 2021 and she has read that she now has lifelong immunity;   Review of Systems  Constitutional: Negative.   HENT: Negative.   Eyes: Negative.   Respiratory: Negative.   Cardiovascular: Negative.   Gastrointestinal: Negative.   Genitourinary: Negative.   Musculoskeletal: Negative.   Skin: Negative.   Neurological: Negative.   Endo/Heme/Allergies: Negative.   Psychiatric/Behavioral: Negative.         Objective:  Vitals:   07/24/20 0907  BP: 124/70  Pulse: 73  Temp: 98.1 F (36.7 C)  TempSrc: Oral  SpO2: 97%  Weight: 147 lb (66.7 kg)  Height: '5\' 5"'  (1.651 m)    General: Well developed, well nourished, in no acute distress  Skin : Warm and dry.  Head: Normocephalic and atraumatic  Eyes: Sclera and conjunctiva clear; pupils round and reactive to light; extraocular movements intact  Ears: External normal; canals clear; tympanic membranes normal  Oropharynx: Pink, supple. No suspicious lesions  Neck: Supple without thyromegaly, adenopathy  Lungs: Respirations unlabored; clear to auscultation bilaterally without wheeze, rales, rhonchi  CVS exam: normal rate, regular rhythm, normal S1, S2, no murmurs, rubs, clicks or gallops, normal rate and regular rhythm.  Abdomen: Soft; nontender;  nondistended; normoactive bowel sounds; no masses or hepatosplenomegaly  Musculoskeletal: No deformities; no active joint inflammation  Extremities: No edema, cyanosis, clubbing  Vessels: Symmetric bilaterally  Neurologic: Alert and oriented; speech intact; face symmetrical; moves all extremities well; CNII-XII intact without focal deficit  Assessment:  1. PE (physical exam), annual   2. Lipid screening   3. Hypothyroidism, unspecified type     Plan:  Age appropriate preventive healthcare needs addressed; encouraged regular eye doctor and dental exams; encouraged regular exercise; will update labs and refills as needed today; follow-up to be determined;  This visit occurred during the SARS-CoV-2 public health emergency.  Safety protocols were in place, including screening questions prior to the visit, additional usage of staff PPE, and extensive cleaning of exam room while observing appropriate contact time as indicated for disinfecting solutions.     No follow-ups on file.  Orders Placed This Encounter  Procedures  . CBC with Differential/Platelet    Standing Status:   Future    Standing Expiration Date:   07/24/2021  . Comp Met (CMET)    Standing Status:   Future    Standing Expiration Date:   07/24/2021  . Lipid panel    Standing Status:   Future    Standing Expiration Date:   07/24/2021  . TSH    Standing Status:   Future    Standing Expiration Date:   07/24/2021    Requested Prescriptions   Signed Prescriptions Disp Refills  . SYNTHROID 50 MCG tablet 90 tablet 3    Sig: Take 1 tablet (50 mcg total) by mouth daily before breakfast.

## 2020-07-25 LAB — COMPREHENSIVE METABOLIC PANEL
AG Ratio: 1.5 (calc) (ref 1.0–2.5)
ALT: 23 U/L (ref 6–29)
AST: 22 U/L (ref 10–35)
Albumin: 4.1 g/dL (ref 3.6–5.1)
Alkaline phosphatase (APISO): 103 U/L (ref 37–153)
BUN: 11 mg/dL (ref 7–25)
CO2: 29 mmol/L (ref 20–32)
Calcium: 9.4 mg/dL (ref 8.6–10.4)
Chloride: 102 mmol/L (ref 98–110)
Creat: 0.86 mg/dL (ref 0.50–0.99)
Globulin: 2.7 g/dL (calc) (ref 1.9–3.7)
Glucose, Bld: 106 mg/dL — ABNORMAL HIGH (ref 65–99)
Potassium: 4.2 mmol/L (ref 3.5–5.3)
Sodium: 137 mmol/L (ref 135–146)
Total Bilirubin: 0.5 mg/dL (ref 0.2–1.2)
Total Protein: 6.8 g/dL (ref 6.1–8.1)

## 2020-07-25 LAB — CBC WITH DIFFERENTIAL/PLATELET
Absolute Monocytes: 510 cells/uL (ref 200–950)
Basophils Absolute: 72 cells/uL (ref 0–200)
Basophils Relative: 1.2 %
Eosinophils Absolute: 228 cells/uL (ref 15–500)
Eosinophils Relative: 3.8 %
HCT: 41.1 % (ref 35.0–45.0)
Hemoglobin: 13.5 g/dL (ref 11.7–15.5)
Lymphs Abs: 2154 cells/uL (ref 850–3900)
MCH: 29.7 pg (ref 27.0–33.0)
MCHC: 32.8 g/dL (ref 32.0–36.0)
MCV: 90.5 fL (ref 80.0–100.0)
MPV: 10.3 fL (ref 7.5–12.5)
Monocytes Relative: 8.5 %
Neutro Abs: 3036 cells/uL (ref 1500–7800)
Neutrophils Relative %: 50.6 %
Platelets: 341 10*3/uL (ref 140–400)
RBC: 4.54 10*6/uL (ref 3.80–5.10)
RDW: 13.5 % (ref 11.0–15.0)
Total Lymphocyte: 35.9 %
WBC: 6 10*3/uL (ref 3.8–10.8)

## 2020-07-25 LAB — LIPID PANEL
Cholesterol: 220 mg/dL — ABNORMAL HIGH (ref <200)
HDL: 57 mg/dL (ref >=50)
LDL Cholesterol (Calc): 137 mg/dL (calc) — ABNORMAL HIGH
Non-HDL Cholesterol (Calc): 163 mg/dL (calc) — ABNORMAL HIGH (ref <130)
Total CHOL/HDL Ratio: 3.9 (calc) (ref <5.0)
Triglycerides: 135 mg/dL (ref <150)

## 2020-07-25 LAB — TSH: TSH: 3.89 mIU/L (ref 0.40–4.50)

## 2020-08-23 ENCOUNTER — Other Ambulatory Visit: Payer: Self-pay | Admitting: Obstetrics and Gynecology

## 2020-08-23 DIAGNOSIS — Z1231 Encounter for screening mammogram for malignant neoplasm of breast: Secondary | ICD-10-CM

## 2020-09-06 ENCOUNTER — Telehealth: Payer: Self-pay | Admitting: Family

## 2020-09-06 NOTE — Telephone Encounter (Signed)
Patient calling to request NEW order for SYNTHROID 50 MCG tablet [facility]. Patient states she needs additional  tablets because she takes an extra dose each Saturday and Sunday  Patient is requesting a call back from [place] when completed

## 2020-09-07 MED ORDER — SYNTHROID 50 MCG PO TABS: ORAL_TABLET | ORAL | 3 refills | Status: DC

## 2020-09-07 NOTE — Telephone Encounter (Signed)
Contacted to notify her that her prescription has now been updated.

## 2020-09-07 NOTE — Telephone Encounter (Signed)
Prescription has been updated as requested.

## 2020-09-27 ENCOUNTER — Other Ambulatory Visit: Payer: Self-pay

## 2020-09-27 ENCOUNTER — Ambulatory Visit
Admission: RE | Admit: 2020-09-27 | Discharge: 2020-09-27 | Disposition: A | Discharge status: Home / Self Care | Payer: Medicare HMO | Source: Ambulatory Visit | Attending: Obstetrics and Gynecology | Admitting: Obstetrics and Gynecology

## 2020-09-27 ENCOUNTER — Ambulatory Visit (INDEPENDENT_AMBULATORY_CARE_PROVIDER_SITE_OTHER): Payer: Medicare HMO

## 2020-09-27 ENCOUNTER — Ambulatory Visit: Payer: Medicare HMO | Admitting: Podiatry

## 2020-09-27 DIAGNOSIS — M7662 Achilles tendinitis, left leg: Secondary | ICD-10-CM

## 2020-09-27 DIAGNOSIS — S9031XA Contusion of right foot, initial encounter: Secondary | ICD-10-CM

## 2020-09-27 DIAGNOSIS — Z1231 Encounter for screening mammogram for malignant neoplasm of breast: Secondary | ICD-10-CM

## 2020-09-27 DIAGNOSIS — M7732 Calcaneal spur, left foot: Secondary | ICD-10-CM

## 2020-09-27 MED ORDER — MELOXICAM 15 MG PO TABS: 15.0000 mg | ORAL_TABLET | Freq: Every day | ORAL | 1 refills | Status: DC

## 2020-09-27 MED ORDER — METHYLPREDNISOLONE 4 MG PO TBPK: ORAL_TABLET | ORAL | 0 refills | Status: DC

## 2020-09-27 NOTE — Progress Notes (Addendum)
   HPI: 69 y.o. female presenting today as a new patient for evaluation of left Achilles tendon pain this been going on for the past few months now.  Pain began whenever she went on a hike just [place] approximately 2-3 months ago.  She has had pain and tenderness ever since.  She has not done anything for treatment.  She presents for further treatment evaluation    Past Medical History:  Diagnosis Date  . Arthritis    fingers  . Atypical mole 01/30/2005   slight atypia on abdomen  . Atypical mole 10/21/2006   slight to moderate atypia on left calf  . Atypical mole 02/15/2008   slight to moderate atypia on right anterior thigh  . Atypical mole 11/02/2008   slight atypia on right buttock  . Cataract   . Hyperlipidemia   . Squamous cell carcinoma of skin 05/03/2020   Atypical squamous proliferation-left thigh posterior- cx3, cautery 57fu  . Thyroid disease       Physical Exam: General: The patient is alert and oriented x3 in no acute distress.  Dermatology: Skin is warm, dry and supple bilateral lower extremities. Negative for open lesions or macerations.  Vascular: Palpable pedal pulses bilaterally. No edema or erythema noted. Capillary refill within normal limits.  Neurological: Epicritic and protective threshold grossly intact bilaterally.   Musculoskeletal Exam: Pain on palpation noted to the posterior tubercle of the left calcaneus at the insertion of the Achilles tendon consistent with retrocalcaneal bursitis. Range of motion within normal limits. Muscle strength 5/5 in all muscle groups bilateral lower extremities.  Radiographic Exam:  Posterior calcaneal spur noted to the respective calcaneus on lateral view. No fracture or dislocation noted. Normal osseous mineralization noted.     Assessment: 1. Insertional Achilles tendinitis left 2. Retrocalcaneal bursitis 3.  Posterior heel spur left   Plan of Care:  1. Patient was evaluated. Radiographs were  reviewed today. 2.  Patient declined injection today 3.  Today we will recommend conservative treatment. 4.  Prescription for Medrol Dosepak 5.  Prescription for meloxicam 15 mg daily to begin after completion of the Dosepak 6.  Recommend cam boot.  Patient has a cam boot at home.  Weightbearing as tolerated x4 weeks 7.  Return to clinic in 4 weeks  *Loves to [employer]. Works for a [employer] manager and [place]   Edrick Kins, DPM [facility]  Dr. Edrick Kins, [address]                                        [place], [place] [address]                Office [phone]  Fax [phone]

## 2020-09-28 ENCOUNTER — Telehealth: Payer: Self-pay | Admitting: Podiatry

## 2020-09-28 NOTE — Telephone Encounter (Signed)
Thank you! I corrected it on my note.  That was my fault. - Dr. Amalia Hailey

## 2020-09-28 NOTE — Telephone Encounter (Signed)
Patient called and stated AVS doesn't reflect the correct foot, everything states right foot but should be left foot

## 2020-10-25 ENCOUNTER — Other Ambulatory Visit: Payer: Self-pay

## 2020-10-25 ENCOUNTER — Ambulatory Visit (INDEPENDENT_AMBULATORY_CARE_PROVIDER_SITE_OTHER): Payer: Medicare HMO | Admitting: Podiatry

## 2020-10-25 DIAGNOSIS — M7732 Calcaneal spur, left foot: Secondary | ICD-10-CM | POA: Diagnosis not present

## 2020-10-25 DIAGNOSIS — M7662 Achilles tendinitis, left leg: Secondary | ICD-10-CM | POA: Diagnosis not present

## 2020-10-25 NOTE — Progress Notes (Signed)
   HPI: 69 y.o. female presenting today for follow-up evaluation of Achilles tendinitis to the left lower extremity.  Patient states she is feeling better.  The Medrol Dosepak helped significantly.  She did not get the prescription for meloxicam.  She presents for further treatment evaluation   Past Medical History:  Diagnosis Date  . Arthritis    fingers  . Atypical mole 01/30/2005   slight atypia on abdomen  . Atypical mole 10/21/2006   slight to moderate atypia on left calf  . Atypical mole 02/15/2008   slight to moderate atypia on right anterior thigh  . Atypical mole 11/02/2008   slight atypia on right buttock  . Cataract   . Hyperlipidemia   . Squamous cell carcinoma of skin 05/03/2020   Atypical squamous proliferation-left thigh posterior- cx3, cautery 74fu  . Thyroid disease       Physical Exam: General: The patient is alert and oriented x3 in no acute distress.  Dermatology: Skin is warm, dry and supple bilateral lower extremities. Negative for open lesions or macerations.  Vascular: Palpable pedal pulses bilaterally. No edema or erythema noted. Capillary refill within normal limits.  Neurological: Epicritic and protective threshold grossly intact bilaterally.   Musculoskeletal Exam: Pain on palpation noted to the posterior tubercle of the left calcaneus at the insertion of the Achilles tendon consistent with retrocalcaneal bursitis. Range of motion within normal limits. Muscle strength 5/5 in all muscle groups bilateral lower extremities.   Assessment: 1. Insertional Achilles tendinitis left 2. Retrocalcaneal bursitis 3.  Posterior heel spur left   Plan of Care:  1. Patient was evaluated.  2.  Patient did not fill the prescription for meloxicam.  Recommend OTC ibuprofen as needed 3.  Recommend daily stretching exercises 4.  Recommend good supportive shoes 5.  Return to clinic as needed  *Loves to [employer]. 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 Edrick Kins, [address] [place]                                        [place], [place] [address]                Office [phone]  Fax [phone]

## 2020-11-01 ENCOUNTER — Other Ambulatory Visit: Payer: Self-pay

## 2020-11-01 ENCOUNTER — Ambulatory Visit: Payer: Medicare HMO | Admitting: Dermatology

## 2020-11-01 DIAGNOSIS — Z85828 Personal history of other malignant neoplasm of skin: Secondary | ICD-10-CM | POA: Diagnosis not present

## 2020-11-01 DIAGNOSIS — L84 Corns and callosities: Secondary | ICD-10-CM

## 2020-11-01 DIAGNOSIS — Z8589 Personal history of malignant neoplasm of other organs and systems: Secondary | ICD-10-CM

## 2020-11-01 NOTE — Patient Instructions (Signed)
Dr. Ila Mcgill

## 2020-11-05 ENCOUNTER — Encounter: Payer: Self-pay | Admitting: Dermatology

## 2020-11-05 NOTE — Progress Notes (Signed)
   Follow-Up Visit   Subjective  Kimberly Tran is a 69 y.o. female who presents for the following: Follow-up (SCC on left thigh. No problems or concerns per patient. She says the scar is ugly. ) and Skin Problem (Also patient has corn on bottom of right foot she wanted looked at.).  Corn Location: Foot Duration:  Quality:  Associated Signs/Symptoms: Painful Modifying Factors:  Severity:  Timing: Context:   Objective  Well appearing patient in no apparent distress; mood and affect are within normal limits.  A focused examination was performed on the legs and feet.   Assessment & Plan    History of squamous cell carcinoma Left Thigh - Posterior  Normal skin examination.  Corn of foot Right 5th Metatarsal Plantar Area  Pare and [place] in office     I, Lavonna Monarch, MD, have reviewed all documentation for this visit.  The documentation on 11/05/20 for the exam, diagnosis, procedures, and orders are all accurate and complete.

## 2020-12-05 ENCOUNTER — Encounter: Payer: Self-pay | Admitting: Internal Medicine

## 2020-12-05 ENCOUNTER — Ambulatory Visit (INDEPENDENT_AMBULATORY_CARE_PROVIDER_SITE_OTHER): Payer: Medicare HMO | Admitting: Internal Medicine

## 2020-12-05 ENCOUNTER — Other Ambulatory Visit: Payer: Self-pay

## 2020-12-05 DIAGNOSIS — R1032 Left lower quadrant pain: Secondary | ICD-10-CM | POA: Diagnosis not present

## 2020-12-05 MED ORDER — AMOXICILLIN-POT CLAVULANATE 875-125 MG PO TABS: 1.0000 | ORAL_TABLET | Freq: Two times a day (BID) | ORAL | 0 refills | Status: AC

## 2020-12-05 NOTE — Progress Notes (Signed)
   Subjective:   Patient ID: Kimberly Tran, female    DOB: October 15, 1951, 69 y.o.   MRN: [id number]  HPI The patient is a 69 YO female coming in for LLQ pain. Started over the weekend with eating pizza. Known diverticulosis left colon. Last colonoscopy 2019. Denies fevers or chills. Denies diarrhea or constipation or blood in stool. Denies back pain. Denies radiation. Overall stable since onset. Worse with lying flat. Better with sitting up. Denies nausea or vomiting. Has not tried anything otc for this. Does have some foods which she cannot tolerate but normally does not last this long. Still eating and drinking normally and this does not affect the pain. Denies urinary symptoms or burning or pain or urgency.   PMH, [facility], social history reviewed and updated  Review of Systems  Constitutional: Negative.   HENT: Negative.   Eyes: Negative.   Respiratory: Negative for cough, chest tightness and shortness of breath.   Cardiovascular: Negative for chest pain, palpitations and leg swelling.  Gastrointestinal: Positive for abdominal pain. Negative for abdominal distention, anal bleeding, blood in stool, constipation, diarrhea, nausea, rectal pain and vomiting.  Musculoskeletal: Negative.   Skin: Negative.   Neurological: Negative.   Psychiatric/Behavioral: Negative.     Objective:  Physical Exam Constitutional:      Appearance: She is well-developed and well-nourished.  HENT:     Head: Normocephalic and atraumatic.  Eyes:     Extraocular Movements: EOM normal.  Cardiovascular:     Rate and Rhythm: Normal rate and regular rhythm.  Pulmonary:     Effort: Pulmonary effort is normal. No respiratory distress.     Breath sounds: Normal breath sounds. No wheezing or rales.  Abdominal:     General: Bowel sounds are normal. There is no distension.     Palpations: Abdomen is soft. There is no mass.     Tenderness: There is abdominal tenderness. There is no right CVA tenderness, left CVA  tenderness, guarding or rebound.     Hernia: No hernia is present.     Comments: Pain LLQ, no rebound or guarding.   Musculoskeletal:        General: No edema.     Cervical back: Normal range of motion.  Skin:    General: Skin is warm and dry.  Neurological:     Mental Status: She is alert and oriented to person, place, and time.     Coordination: Coordination normal.  Psychiatric:        Mood and Affect: Mood and affect normal.     Vitals:   12/05/20 1447  BP: (!) 160/80  Pulse: 92  Temp: 97.8 F (36.6 C)  TempSrc: Oral  SpO2: 95%  Weight: 147 lb (66.7 kg)  Height: 5\' 5"  (1.651 m)    This visit occurred during the SARS-CoV-2 public health emergency.  Safety protocols were in place, including screening questions prior to the visit, additional usage of staff PPE, and extensive cleaning of exam room while observing appropriate contact time as indicated for disinfecting solutions.   Assessment & Plan:

## 2020-12-05 NOTE — Patient Instructions (Signed)
We have sent in augmentin to take 1 pill twice a day for 1 week. 

## 2020-12-05 NOTE — Assessment & Plan Note (Signed)
Likely diverticulitis. Has known diverticulosis in that location. Would like to avoid imaging if possible. Rx augmentin 1 week course. If no improvement in 2 days or worsening let us know and we can move forward with imaging/labs. Good PO intake.

## 2021-01-14 IMAGING — MG DIGITAL SCREENING BILAT W/ TOMO W/ CAD
8 series · 9 of 24 positions shown · non-contrast
Comparison: Previous exam(s).

CLINICAL DATA: Screening.

EXAM:
DIGITAL SCREENING BILATERAL MAMMOGRAM WITH TOMO AND CAD

[L MLO synth-2D]
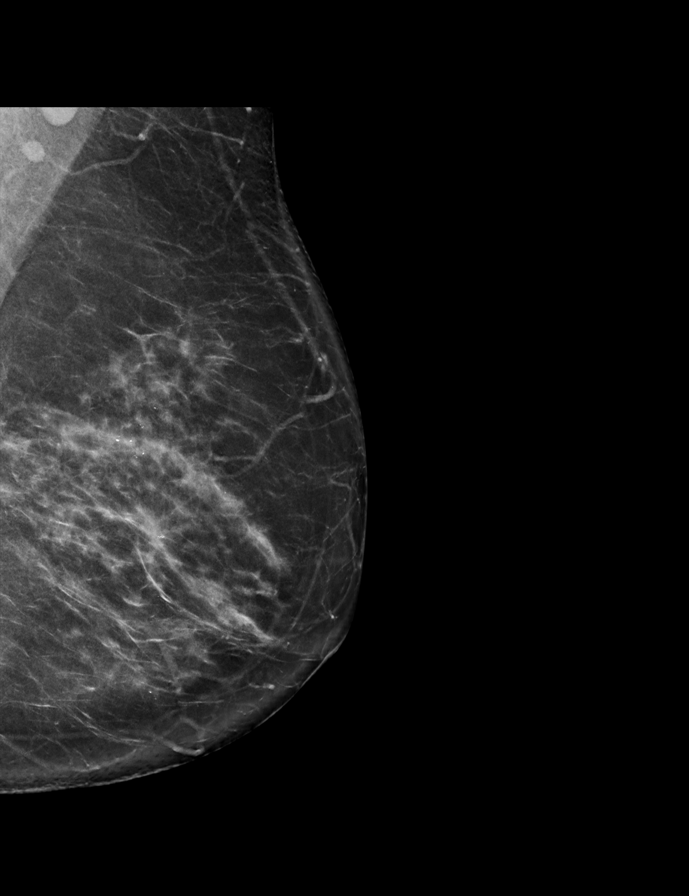

[L CC synth-2D]
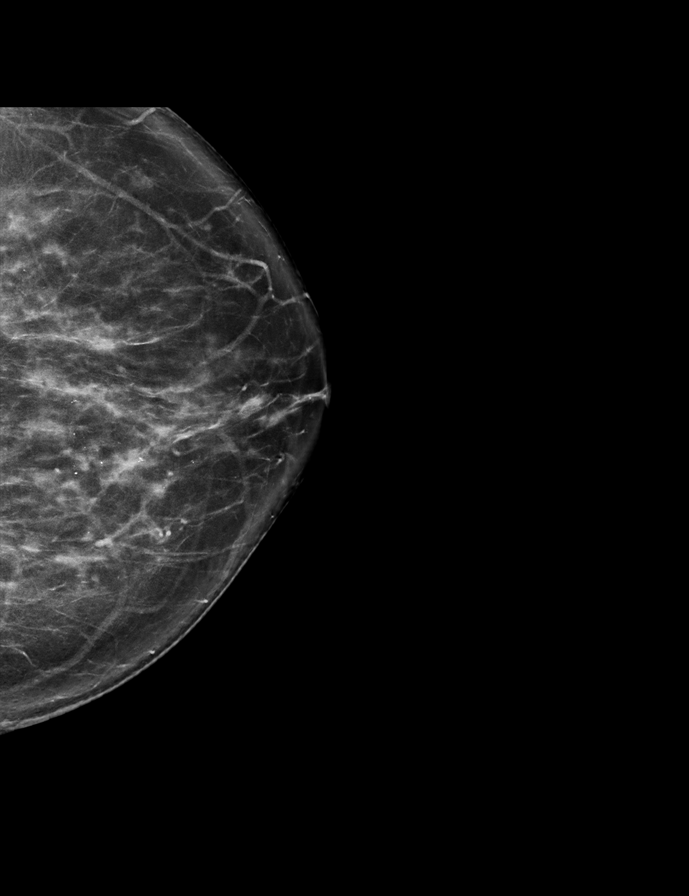

[R CC synth-2D]
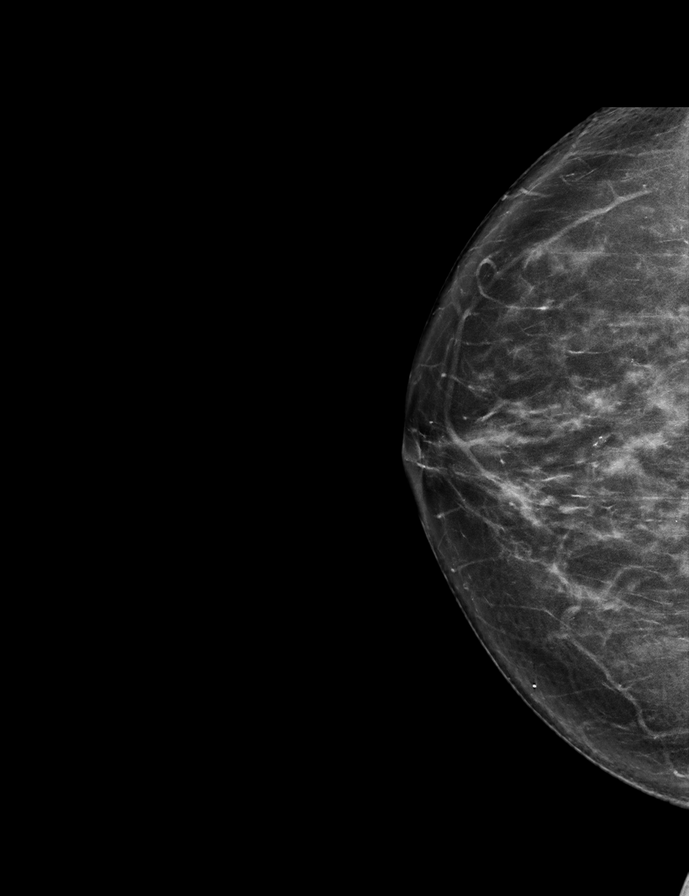

[R MLO synth-2D]
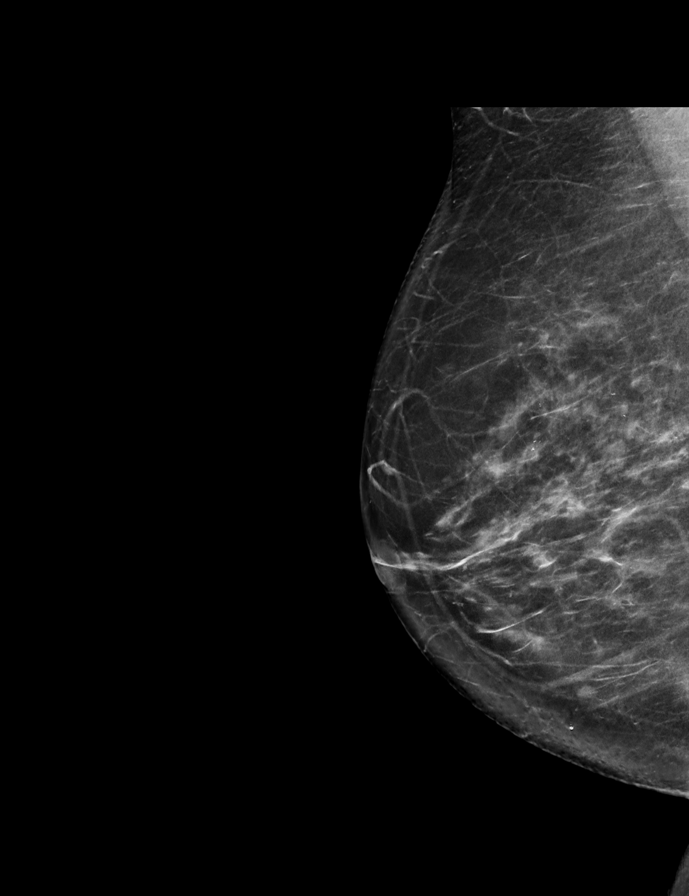

[R CC tomo · 2 of 71 frames shown]
[frame 23/71]
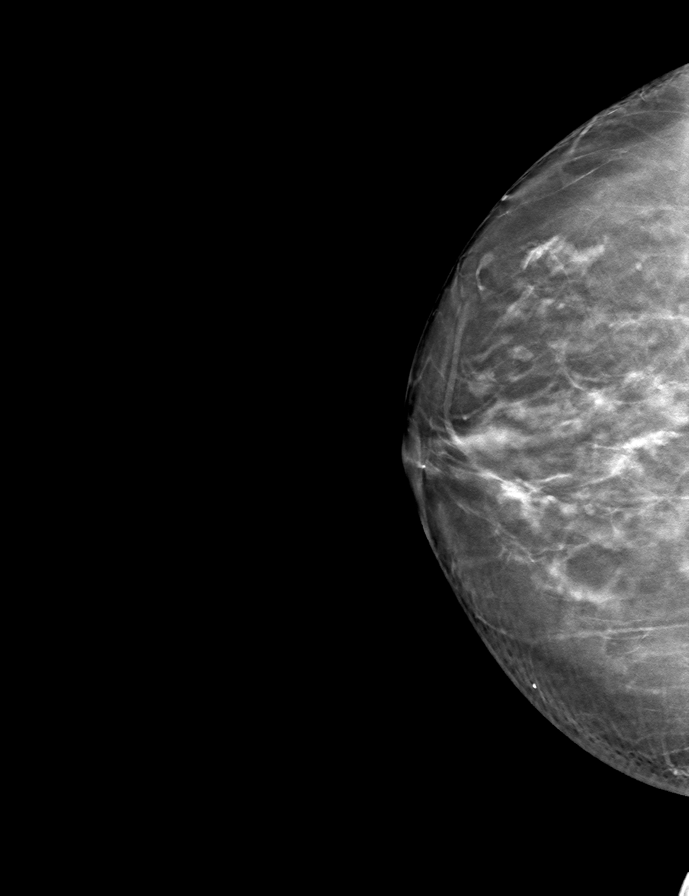
[frame 36/71]
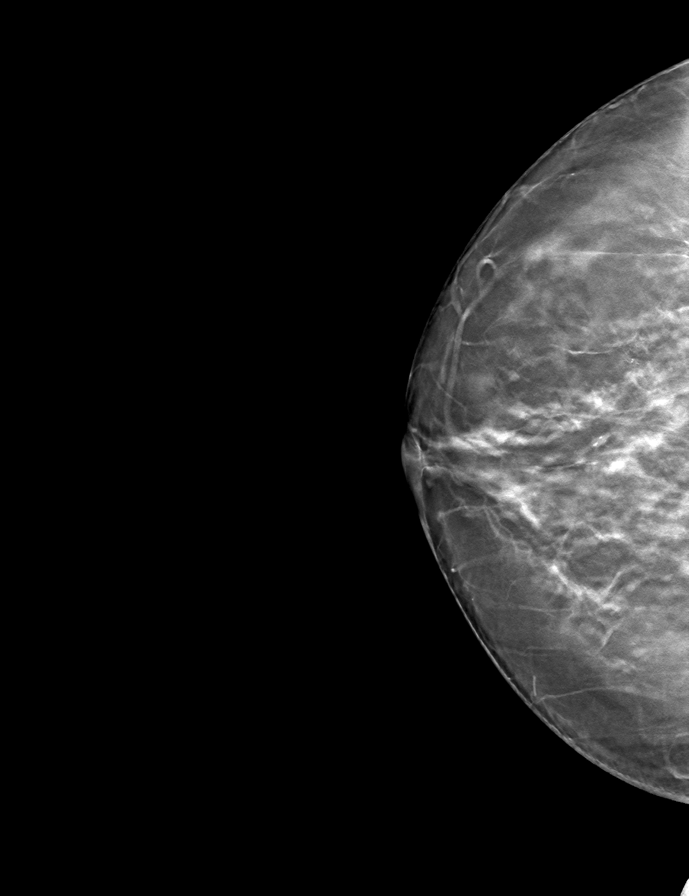

[L CC tomo · tomo slice 38/75.0]
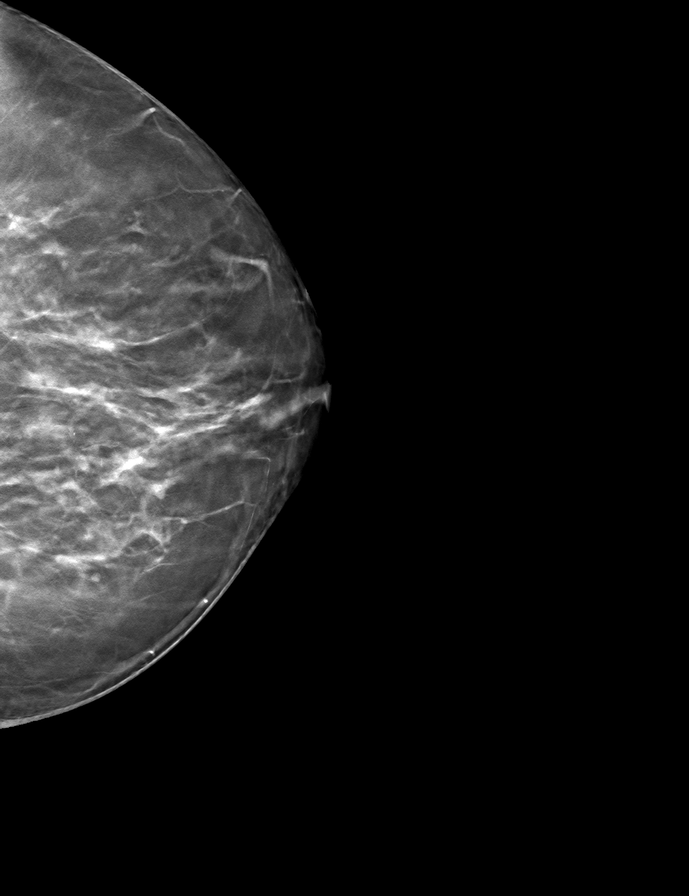

[R MLO tomo · tomo slice 39/77.0]
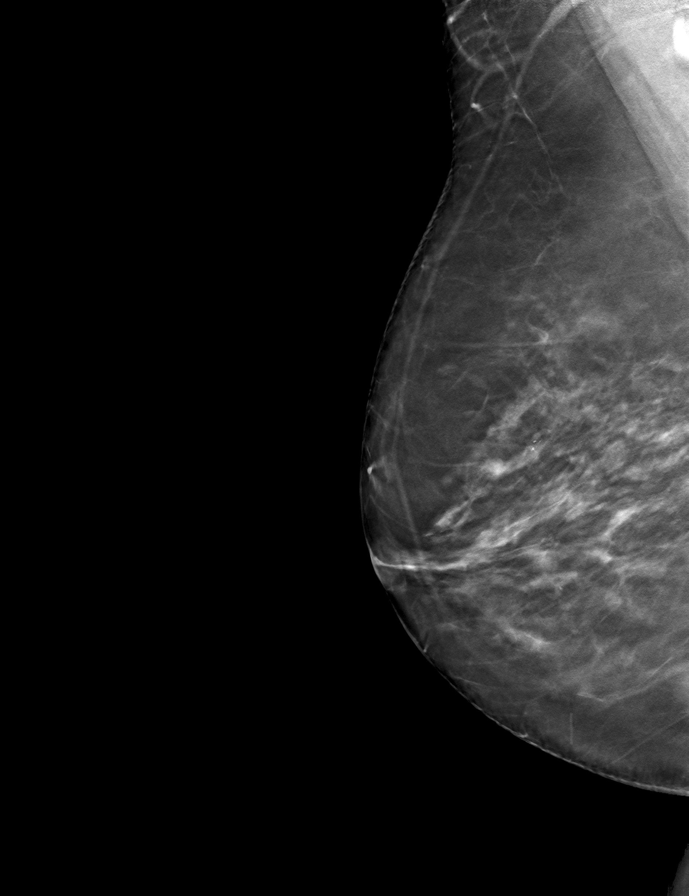

[L MLO tomo · tomo slice 40/79.0]
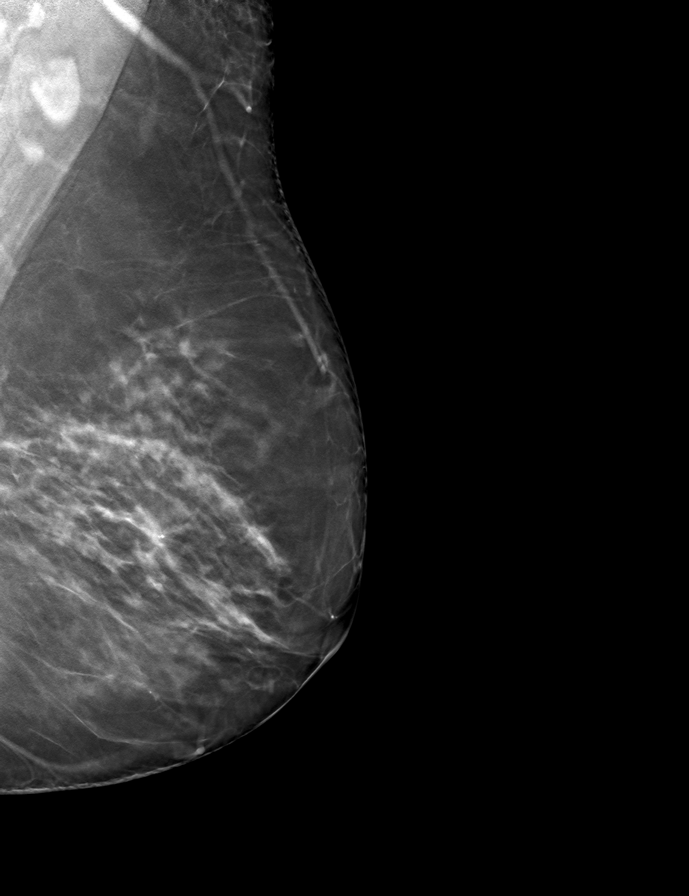

[9 of 24 positions shown; findings below may reference images not displayed]

ACR Breast Density Category c: The breast tissue is heterogeneously
dense, which may obscure small masses.
FINDINGS: There are no findings suspicious for malignancy. Images were
processed with CAD.
IMPRESSION: No mammographic evidence of malignancy. A result letter of this
screening mammogram will be mailed directly to the patient.

RECOMMENDATION:
Screening mammogram in one year. (Code:FT-U-LHB)

BI-RADS CATEGORY  1: Negative.

## 2021-01-17 ENCOUNTER — Ambulatory Visit (INDEPENDENT_AMBULATORY_CARE_PROVIDER_SITE_OTHER): Payer: Medicare HMO | Admitting: Podiatry

## 2021-01-17 ENCOUNTER — Encounter: Payer: Self-pay | Admitting: Podiatry

## 2021-01-17 ENCOUNTER — Ambulatory Visit (INDEPENDENT_AMBULATORY_CARE_PROVIDER_SITE_OTHER): Payer: Medicare HMO

## 2021-01-17 ENCOUNTER — Other Ambulatory Visit: Payer: Self-pay

## 2021-01-17 DIAGNOSIS — M7662 Achilles tendinitis, left leg: Secondary | ICD-10-CM | POA: Diagnosis not present

## 2021-01-17 DIAGNOSIS — M7751 Other enthesopathy of right foot: Secondary | ICD-10-CM | POA: Diagnosis not present

## 2021-01-17 DIAGNOSIS — R52 Pain, unspecified: Secondary | ICD-10-CM

## 2021-01-17 DIAGNOSIS — M778 Other enthesopathies, not elsewhere classified: Secondary | ICD-10-CM | POA: Diagnosis not present

## 2021-01-18 ENCOUNTER — Other Ambulatory Visit: Payer: Self-pay | Admitting: Podiatry

## 2021-01-18 DIAGNOSIS — M7751 Other enthesopathy of right foot: Secondary | ICD-10-CM

## 2021-01-18 NOTE — Progress Notes (Signed)
Subjective:   Patient ID: Kimberly Tran, female   DOB: 70 y.o.   MRN: [id number]   HPI Patient states her foot is improved from numerous different problems and states she is walking with a better heel toe gait   ROS      Objective:  Physical Exam  Neurovascular status intact negative Bevelyn Buckles' sign noted patient's right foot is improving with discomfort that has moderated with mild pain in the right dorsum foot and into the Achilles tendon localized with no proximal edema erythema drainage noted     Assessment:  Improvement of 2 separate areas of discomfort with probability for compensatory mechanism     Plan:  H&P reviewed both conditions discussed continued anti-inflammatories physical therapy ice as needed and shoe gear modification.  Patient will be seen back to recheck is encouraged to call questions concerns

## 2021-01-22 ENCOUNTER — Other Ambulatory Visit: Payer: Self-pay | Admitting: Family

## 2021-01-22 ENCOUNTER — Telehealth (INDEPENDENT_AMBULATORY_CARE_PROVIDER_SITE_OTHER): Payer: Medicare HMO | Admitting: Family

## 2021-01-22 DIAGNOSIS — J069 Acute upper respiratory infection, unspecified: Secondary | ICD-10-CM

## 2021-01-22 DIAGNOSIS — J029 Acute pharyngitis, unspecified: Secondary | ICD-10-CM

## 2021-01-22 NOTE — Progress Notes (Signed)
Kimberly Tran is a 70 y.o. female with the following history as recorded in EpicCare:  Patient Active Problem List   Diagnosis Date Noted  . LLQ pain 12/05/2020    Current Outpatient Medications  Medication Sig Dispense Refill  . betamethasone valerate ointment (VALISONE) 0.1 % APPLY AS DIRECTED TWICE DAILY  6  . BIOTIN PO Take 1 capsule by mouth daily.    . Calcium Citrate-Vitamin D (CALCITRATE/VITAMIN D PO) Take by mouth.    . doxycycline (VIBRAMYCIN) 50 MG capsule Take 50 mg by mouth daily as needed. For rosacea    . fluocinonide ointment (LIDEX) 0.05 % Apply topically 2 (two) times daily. 60 g 3  . MAGNESIUM ASPARTATE PO Take by mouth.    . meloxicam (MOBIC) 15 MG tablet Take 1 tablet (15 mg total) by mouth daily. 30 tablet 1  . neomycin-polymyxin b-dexamethasone (MAXITROL) 3.5-10000-0.1 OINT     . SYNTHROID 50 MCG tablet Take 1 tablet daily M-F; take 2 tablets/day on Saturday and Sunday 108 tablet 3  . TURMERIC PO Take by mouth.    . valACYclovir (VALTREX) 1000 MG tablet Take 1,000 mg by mouth daily.    . valACYclovir (VALTREX) 500 MG tablet Take 500 mg by mouth daily as needed. For outbreak     No current facility-administered medications for this visit.    Allergies: Patient has no known allergies.  Past Medical History:  Diagnosis Date  . Arthritis    fingers  . Atypical mole 01/30/2005   slight atypia on abdomen  . Atypical mole 10/21/2006   slight to moderate atypia on left calf  . Atypical mole 02/15/2008   slight to moderate atypia on right anterior thigh  . Atypical mole 11/02/2008   slight atypia on right buttock  . Cataract   . Hyperlipidemia   . Squamous cell carcinoma of skin 05/03/2020   Atypical squamous proliferation-left thigh posterior- cx3, cautery 56fu  . Thyroid disease     Past Surgical History:  Procedure Laterality Date  . COLONOSCOPY    . HERNIA REPAIR      Family History  Problem Relation Age of Onset  . Lung cancer Mother   .  Diabetes Father   . Heart disease Father   . Hypertension Brother   . Colon cancer Neg Hx   . Colon polyps Neg Hx   . Esophageal cancer Neg Hx   . Rectal cancer Neg Hx   . Stomach cancer Neg Hx     Social History   Tobacco Use  . Smoking status: Never Smoker  . Smokeless tobacco: Never Used  Substance Use Topics  . Alcohol use: No    Subjective:   I connected with Kimberly Tran on 01/22/21 at 11:00 AM EST by a video enabled telemedicine application and verified that I am speaking with the correct person using two identifiers.   I discussed the limitations of evaluation and management by telemedicine and the availability of in person appointments. The patient expressed understanding and agreed to proceed. Provider in office/ patient is at home; provider and patient are only 2 people on video call.   Started Saturday evening with sore throat/ congestion; temperature has been as high as 100; no difficulty breathing; no OTC medications; has not taken home COVID test.     Objective:  There were no vitals filed for this visit.  General: Well developed, well nourished, in no acute distress  Head: Normocephalic and atraumatic  Lungs: Respirations unlabored;  Neurologic: Alert  and oriented; speech intact; face symmetrical; moves all extremities well; CNII-XII intact without focal deficit   Assessment:  1. Viral URI   2. Sore throat     Plan:  Symptomatic treatment discussed; do not feel she has sinus infection since symptoms present for 2 days; will get her scheduled for COVID test; increase fluids, rest and follow-up to be determined.  No follow-ups on file.  No orders of the defined types were placed in this encounter.   Requested Prescriptions    No prescriptions requested or ordered in this encounter

## 2021-01-23 LAB — NOVEL CORONAVIRUS, NAA: SARS-CoV-2, NAA: DETECTED — AB

## 2021-01-23 LAB — SARS-COV-2, NAA 2 DAY TAT

## 2021-01-24 ENCOUNTER — Telehealth: Payer: Self-pay | Admitting: *Deleted

## 2021-01-24 NOTE — Telephone Encounter (Signed)
Called to discuss with patient about COVID-19 symptoms and the use of one of the available treatments for those with mild to moderate Covid symptoms and at a high risk of hospitalization.  Pt appears to qualify for outpatient treatment due to co-morbid conditions and/or a member of an at-risk group in accordance with the FDA Emergency Use Authorization.    Symptom onset: Saturday night according to chart note with ST and congestion. Vaccinated: No Booster?  Immunocompromised?  Qualifiers: ?  Unable to reach pt - Left VM to return call for information.  Tarry Kos

## 2021-07-30 ENCOUNTER — Encounter: Payer: Self-pay | Admitting: Internal Medicine

## 2021-07-30 ENCOUNTER — Ambulatory Visit (INDEPENDENT_AMBULATORY_CARE_PROVIDER_SITE_OTHER): Payer: Medicare HMO | Admitting: Internal Medicine

## 2021-07-30 ENCOUNTER — Other Ambulatory Visit: Payer: Self-pay

## 2021-07-30 VITALS — BP 122/70 | HR 67 | Temp 97.8°F | Resp 18 | Ht 65.0 in | Wt 142.0 lb

## 2021-07-30 DIAGNOSIS — Z23 Encounter for immunization: Secondary | ICD-10-CM

## 2021-07-30 DIAGNOSIS — E039 Hypothyroidism, unspecified: Secondary | ICD-10-CM

## 2021-07-30 DIAGNOSIS — Z0000 Encounter for general adult medical examination without abnormal findings: Secondary | ICD-10-CM | POA: Diagnosis not present

## 2021-07-30 LAB — LIPID PANEL
Cholesterol: 235 mg/dL — ABNORMAL HIGH (ref 0–200)
HDL: 57.5 mg/dL (ref >39.00)
LDL Cholesterol: 152 mg/dL — ABNORMAL HIGH (ref 0–99)
NonHDL: 177.07
Total CHOL/HDL Ratio: 4
Triglycerides: 125 mg/dL (ref 0.0–149.0)
VLDL: 25 mg/dL (ref 0.0–40.0)

## 2021-07-30 LAB — COMPREHENSIVE METABOLIC PANEL
ALT: 24 U/L (ref 0–35)
AST: 21 U/L (ref 0–37)
Albumin: 4.2 g/dL (ref 3.5–5.2)
Alkaline Phosphatase: 91 U/L (ref 39–117)
BUN: 11 mg/dL (ref 6–23)
CO2: 30 mEq/L (ref 19–32)
Calcium: 9.5 mg/dL (ref 8.4–10.5)
Chloride: 101 mEq/L (ref 96–112)
Creatinine, Ser: 0.87 mg/dL (ref 0.40–1.20)
GFR: 67.52 mL/min (ref >60.00)
Glucose, Bld: 99 mg/dL (ref 70–99)
Potassium: 4.4 mEq/L (ref 3.5–5.1)
Sodium: 137 mEq/L (ref 135–145)
Total Bilirubin: 0.7 mg/dL (ref 0.2–1.2)
Total Protein: 7 g/dL (ref 6.0–8.3)

## 2021-07-30 LAB — CBC
HCT: 40.4 % (ref 36.0–46.0)
Hemoglobin: 13.5 g/dL (ref 12.0–15.0)
MCHC: 33.4 g/dL (ref 30.0–36.0)
MCV: 86.3 fl (ref 78.0–100.0)
Platelets: 357 10*3/uL (ref 150.0–400.0)
RBC: 4.69 Mil/uL (ref 3.87–5.11)
RDW: 13.3 % (ref 11.5–15.5)
WBC: 6.9 10*3/uL (ref 4.0–10.5)

## 2021-07-30 LAB — TSH: TSH: 2.01 u[IU]/mL (ref 0.35–5.50)

## 2021-07-30 NOTE — Assessment & Plan Note (Signed)
Checking TSH and adjust synthroid as needed. Takes 50 mcg M-F and 100 mcg Sat/Sun.

## 2021-07-30 NOTE — Patient Instructions (Signed)
The EKG of the heart is normal.

## 2021-07-30 NOTE — Progress Notes (Signed)
   Subjective:   Patient ID: Kimberly Tran, female    DOB: 08-Dec-1951, 70 y.o.   MRN: [id number]  HPI The patient is a 70 YO female coming in for [facility] and physical.   PMH, [place], social history reviewed and updated  Review of Systems  Constitutional: Negative.   HENT: Negative.    Eyes: Negative.   Respiratory:  Negative for cough, chest tightness and shortness of breath.   Cardiovascular:  Negative for chest pain, palpitations and leg swelling.  Gastrointestinal:  Negative for abdominal distention, abdominal pain, constipation, diarrhea, nausea and vomiting.  Musculoskeletal: Negative.   Skin: Negative.   Neurological: Negative.   Psychiatric/Behavioral: Negative.     Objective:  Physical Exam Constitutional:      Appearance: She is well-developed.  HENT:     Head: Normocephalic and atraumatic.  Cardiovascular:     Rate and Rhythm: Normal rate and regular rhythm.  Pulmonary:     Effort: Pulmonary effort is normal. No respiratory distress.     Breath sounds: Normal breath sounds. No wheezing or rales.  Abdominal:     General: Bowel sounds are normal. There is no distension.     Palpations: Abdomen is soft.     Tenderness: There is no abdominal tenderness. There is no rebound.  Musculoskeletal:     Cervical back: Normal range of motion.  Skin:    General: Skin is warm and dry.  Neurological:     Mental Status: She is alert and oriented to person, place, and time.     Coordination: Coordination normal.    Vitals:   07/30/21 0904  BP: 122/70  Pulse: 67  Resp: 18  Temp: 97.8 F (36.6 C)  TempSrc: Oral  SpO2: 97%  Weight: 142 lb (64.4 kg)  Height: '5\' 5"'$  (1.651 m)   EKG: Rate 60, axis normal, interval normal, sinus, no st or t wave changes, no significant change compared to prior 2013  This visit occurred during the SARS-CoV-2 public health emergency.  Safety protocols were in place, including screening questions prior to the visit, additional usage of staff PPE,  and extensive cleaning of exam room while observing appropriate contact time as indicated for disinfecting solutions.   Assessment & Plan:  Pneumonia 23 given at visit

## 2021-07-30 NOTE — Assessment & Plan Note (Signed)
Flu shot counseled. Covid-19 declines. Pneumonia given 23 to complete series. Shingrix declines. Tetanus up to date. Colonoscopy up to date. Mammogram up to date, pap smear aged out and dexa up to date. Counseled about sun safety and mole surveillance. Counseled about the dangers of distracted driving. Given 10 year screening recommendations.

## 2021-08-22 ENCOUNTER — Other Ambulatory Visit: Payer: Self-pay | Admitting: Obstetrics and Gynecology

## 2021-08-22 DIAGNOSIS — Z1231 Encounter for screening mammogram for malignant neoplasm of breast: Secondary | ICD-10-CM

## 2021-08-27 ENCOUNTER — Encounter: Payer: Self-pay | Admitting: Dermatology

## 2021-08-27 ENCOUNTER — Ambulatory Visit: Payer: Medicare HMO | Admitting: Dermatology

## 2021-08-27 ENCOUNTER — Other Ambulatory Visit: Payer: Self-pay

## 2021-08-27 DIAGNOSIS — L659 Nonscarring hair loss, unspecified: Secondary | ICD-10-CM | POA: Diagnosis not present

## 2021-08-27 DIAGNOSIS — L84 Corns and callosities: Secondary | ICD-10-CM | POA: Diagnosis not present

## 2021-08-27 DIAGNOSIS — L821 Other seborrheic keratosis: Secondary | ICD-10-CM | POA: Diagnosis not present

## 2021-08-27 DIAGNOSIS — L570 Actinic keratosis: Secondary | ICD-10-CM

## 2021-08-27 DIAGNOSIS — L853 Xerosis cutis: Secondary | ICD-10-CM

## 2021-08-27 NOTE — Patient Instructions (Signed)
10% urea cream for heels.

## 2021-09-02 ENCOUNTER — Encounter: Payer: Self-pay | Admitting: Dermatology

## 2021-09-02 NOTE — Progress Notes (Signed)
   Follow-Up Visit   Subjective  Kimberly Tran is a 70 y.o. female who presents for the following: Skin Problem (corn on both feet, scalp isk she wants looked at as well).  Painful spots on bottom of the feet and crusts on scalp Location:  Duration:  Quality:  Associated Signs/Symptoms: Modifying Factors:  Severity:  Timing: Context:   Objective  Well appearing patient in no apparent distress; mood and affect are within normal limits. Left Parietal Scalp, Mid Frontal Scalp, Mid Parietal Scalp Mixed gritty pink Aks and irritated Sks scalp.     left abdomen Stuck-on, waxy, tan-brown papules and plaques. --Discussed benign etiology and prognosis.        A focused examination was performed including head, neck, feet.. Relevant physical exam findings are noted in the Assessment and Plan.   Assessment & Plan    AK (actinic keratosis) (3) Left Parietal Scalp; Mid Frontal Scalp; Mid Parietal Scalp  Destruction of lesion - Left Parietal Scalp, Mid Frontal Scalp, Mid Parietal Scalp Complexity: simple   Destruction method: cryotherapy   Informed consent: discussed and consent obtained   Timeout:  patient name, date of birth, surgical site, and procedure verified Lesion destroyed using liquid nitrogen: Yes   Cryotherapy cycles:  3 Outcome: patient tolerated procedure well with no complications   Post-procedure details: wound care instructions given    Corns and callosities (2) Left Middle Plantar Surface; Right Middle Plantar Surface  I pared the painful lesions deeply and applied DCA.  Seborrheic keratosis left abdomen  Benign no treatment needed.   Destruction of lesion - left abdomen Complexity: simple   Destruction method: cryotherapy   Informed consent: discussed and consent obtained   Timeout:  patient name, date of birth, surgical site, and procedure verified Lesion destroyed using liquid nitrogen: Yes   Cryotherapy cycles:  1 Outcome: patient  tolerated procedure well with no complications   Post-procedure details: wound care instructions given    Nonscarring hair loss Scalp  Spoke about patient using the [employer] but she uses the organic brand. She will get [place] the name.  Also spoke about the mini dose of oral minoxidil but did not initiate it.   Dry skin (2) Right Plantar Surface of Heel; Left Posterior Heel Region  10% urea cream.      I, Lavonna Monarch, MD, have reviewed all documentation for this visit.  The documentation on 09/02/21 for the exam, diagnosis, procedures, and orders are all accurate and complete.

## 2021-09-18 ENCOUNTER — Other Ambulatory Visit: Payer: Self-pay | Admitting: Family

## 2021-10-02 ENCOUNTER — Other Ambulatory Visit: Payer: Self-pay

## 2021-10-02 ENCOUNTER — Ambulatory Visit
Admission: RE | Admit: 2021-10-02 | Discharge: 2021-10-02 | Disposition: A | Discharge status: Home / Self Care | Payer: Medicare HMO | Source: Ambulatory Visit | Attending: Obstetrics and Gynecology | Admitting: Obstetrics and Gynecology

## 2021-10-02 DIAGNOSIS — Z1231 Encounter for screening mammogram for malignant neoplasm of breast: Secondary | ICD-10-CM

## 2021-12-31 ENCOUNTER — Ambulatory Visit: Payer: Medicare HMO | Admitting: Dermatology

## 2021-12-31 ENCOUNTER — Other Ambulatory Visit: Payer: Self-pay

## 2021-12-31 ENCOUNTER — Encounter: Payer: Self-pay | Admitting: Dermatology

## 2021-12-31 DIAGNOSIS — D485 Neoplasm of uncertain behavior of skin: Secondary | ICD-10-CM

## 2021-12-31 DIAGNOSIS — L820 Inflamed seborrheic keratosis: Secondary | ICD-10-CM

## 2021-12-31 DIAGNOSIS — Z85828 Personal history of other malignant neoplasm of skin: Secondary | ICD-10-CM | POA: Diagnosis not present

## 2021-12-31 DIAGNOSIS — B079 Viral wart, unspecified: Secondary | ICD-10-CM

## 2021-12-31 DIAGNOSIS — Z86018 Personal history of other benign neoplasm: Secondary | ICD-10-CM

## 2021-12-31 DIAGNOSIS — L720 Epidermal cyst: Secondary | ICD-10-CM

## 2021-12-31 NOTE — Patient Instructions (Signed)

## 2022-01-20 ENCOUNTER — Encounter: Payer: Self-pay | Admitting: Dermatology

## 2022-01-20 NOTE — Progress Notes (Signed)
° °  Follow-Up Visit   Subjective  Kimberly Tran is a 71 y.o. female who presents for the following: Skin Problem (Lesion on forehead x years. Lesions on back and leg. Personal history of scc and atypical moles. ).  Enlarging crust on forehead plus several other areas to check Location:  Duration:  Quality:  Associated Signs/Symptoms: Modifying Factors:  Severity:  Timing: Context:   Objective  Well appearing patient in no apparent distress; mood and affect are within normal limits. Left Thigh - Posterior (3) Pink inflamed itchy Textured 6 mm papule  Mid Forehead Waxy slightly verrucous 5 mm pink peel, rule out superficial carcinoma     Right Forehead Half millimeter hard upper dermal white papule    A focused examination was performed including head, neck, arms, legs. Relevant physical exam findings are noted in the Assessment and Plan.   Assessment & Plan    Seborrheic keratosis, inflamed (3) Left Thigh - Posterior  Destruction of lesion - Left Thigh - Posterior Complexity: simple   Destruction method: cryotherapy   Informed consent: discussed and consent obtained   Timeout:  patient name, date of birth, surgical site, and procedure verified Lesion destroyed using liquid nitrogen: Yes   Cryotherapy cycles:  3 Outcome: patient tolerated procedure well with no complications   Post-procedure details: wound care instructions given    Neoplasm of uncertain behavior of skin Mid Forehead  Skin / nail biopsy Type of biopsy: tangential   Informed consent: discussed and consent obtained   Timeout: patient name, date of birth, surgical site, and procedure verified   Anesthesia: the lesion was anesthetized in a standard fashion   Anesthetic:  1% lidocaine w/ epinephrine 1-100,000 local infiltration Instrument used: flexible razor blade   Hemostasis achieved with: aluminum chloride and electrodesiccation   Outcome: patient tolerated procedure well    Post-procedure details: wound care instructions given    Specimen 1 - Surgical pathology Differential Diagnosis: bcc vs scc  Check Margins: No  Milia Right Forehead  May choose future removal.      I, Lavonna Monarch, MD, have reviewed all documentation for this visit.  The documentation on 01/20/22 for the exam, diagnosis, procedures, and orders are all accurate and complete.

## 2022-01-29 ENCOUNTER — Ambulatory Visit (INDEPENDENT_AMBULATORY_CARE_PROVIDER_SITE_OTHER): Payer: Medicare HMO | Admitting: Internal Medicine

## 2022-01-29 ENCOUNTER — Other Ambulatory Visit: Payer: Self-pay

## 2022-01-29 VITALS — BP 142/76 | HR 77 | Temp 98.3°F | Ht 66.0 in | Wt 145.0 lb

## 2022-01-29 DIAGNOSIS — R1030 Lower abdominal pain, unspecified: Secondary | ICD-10-CM | POA: Insufficient documentation

## 2022-01-29 DIAGNOSIS — R739 Hyperglycemia, unspecified: Secondary | ICD-10-CM | POA: Diagnosis not present

## 2022-01-29 DIAGNOSIS — E785 Hyperlipidemia, unspecified: Secondary | ICD-10-CM | POA: Insufficient documentation

## 2022-01-29 DIAGNOSIS — R030 Elevated blood-pressure reading, without diagnosis of hypertension: Secondary | ICD-10-CM | POA: Insufficient documentation

## 2022-01-29 DIAGNOSIS — R1032 Left lower quadrant pain: Secondary | ICD-10-CM

## 2022-01-29 DIAGNOSIS — R7303 Prediabetes: Secondary | ICD-10-CM | POA: Insufficient documentation

## 2022-01-29 LAB — URINALYSIS, ROUTINE W REFLEX MICROSCOPIC
Bilirubin Urine: NEGATIVE
Hgb urine dipstick: NEGATIVE
Ketones, ur: NEGATIVE
Leukocytes,Ua: NEGATIVE
Nitrite: NEGATIVE
Specific Gravity, Urine: 1.01 (ref 1.000–1.030)
Total Protein, Urine: NEGATIVE
Urine Glucose: NEGATIVE
Urobilinogen, UA: 0.2 (ref 0.0–1.0)
pH: 6.5 (ref 5.0–8.0)

## 2022-01-29 LAB — CBC WITH DIFFERENTIAL/PLATELET
Basophils Absolute: 0.1 10*3/uL (ref 0.0–0.1)
Basophils Relative: 0.7 % (ref 0.0–3.0)
Eosinophils Absolute: 0.1 10*3/uL (ref 0.0–0.7)
Eosinophils Relative: 0.7 % (ref 0.0–5.0)
HCT: 41 % (ref 36.0–46.0)
Hemoglobin: 13.5 g/dL (ref 12.0–15.0)
Lymphocytes Relative: 20.6 % (ref 12.0–46.0)
Lymphs Abs: 2.3 10*3/uL (ref 0.7–4.0)
MCHC: 32.8 g/dL (ref 30.0–36.0)
MCV: 86.6 fl (ref 78.0–100.0)
Monocytes Absolute: 1.1 10*3/uL — ABNORMAL HIGH (ref 0.1–1.0)
Monocytes Relative: 9.8 % (ref 3.0–12.0)
Neutro Abs: 7.7 10*3/uL (ref 1.4–7.7)
Neutrophils Relative %: 68.2 % (ref 43.0–77.0)
Platelets: 355 10*3/uL (ref 150.0–400.0)
RBC: 4.74 Mil/uL (ref 3.87–5.11)
RDW: 12.5 % (ref 11.5–15.5)
WBC: 11.4 10*3/uL — ABNORMAL HIGH (ref 4.0–10.5)

## 2022-01-29 LAB — HEMOGLOBIN A1C: Hgb A1c MFr Bld: 6 % (ref 4.6–6.5)

## 2022-01-29 MED ORDER — CIPROFLOXACIN HCL 500 MG PO TABS: 500.0000 mg | ORAL_TABLET | Freq: Two times a day (BID) | ORAL | 0 refills | Status: AC

## 2022-01-29 MED ORDER — METRONIDAZOLE 500 MG PO TABS: 500.0000 mg | ORAL_TABLET | Freq: Three times a day (TID) | ORAL | 0 refills | Status: AC

## 2022-01-29 MED ORDER — CEFTRIAXONE SODIUM 1 G IJ SOLR
1.0000 g | Freq: Once | INTRAMUSCULAR | Status: AC
  Administered 2022-01-29: 1 g via INTRAMUSCULAR

## 2022-01-29 NOTE — Progress Notes (Signed)
Patient ID: HARVEST DEIST, female   DOB: Sep 18, 1951, 70 y.o.   MRN: [id number]        Chief Complaint: follow up LLQ pain       HPI:  Kimberly Tran is a 71 y.o. female here with new onset recurrent gradually worsening LLQ pain after tx for presumed acute diverticulitis in dec 2020 with po antibx.  Now c/o similar pain x 3 days, mod to severe, constant , better to sit quietly, worse to stand up and walking, crampy and occasionally sharp, with few radiating pains to the left lower back.  Has some nausea, no vomiting, BM's seem no change, no blood, no GU symptoms and no hx of renal stone.  No recent ETOH use or nsaids.  Pt denies chest pain, increased sob or doe, wheezing, orthopnea, PND, increased LE swelling, palpitations, dizziness or syncope.   Pt denies polydipsia, polyuria, or new focal neuro s/s.   Pt denies fever, wt loss, night sweats, loss of appetite, or other constitutional symptoms      Last colonoscopy 2019 with left sided diverticulosis Wt Readings from Last 3 Encounters:  01/29/22 145 lb (65.8 kg)  07/30/21 142 lb (64.4 kg)  12/05/20 147 lb (66.7 kg)   BP Readings from Last 3 Encounters:  01/29/22 (!) 142/76  07/30/21 122/70  12/05/20 (!) 160/80         Past Medical History:  Diagnosis Date   Arthritis    fingers   Atypical mole 01/30/2005   slight atypia on abdomen   Atypical mole 10/21/2006   slight to moderate atypia on left calf   Atypical mole 02/15/2008   slight to moderate atypia on right anterior thigh   Atypical mole 11/02/2008   slight atypia on right buttock   Cataract    Hyperlipidemia    Squamous cell carcinoma of skin 05/03/2020   Atypical squamous proliferation-left thigh posterior- cx3, cautery 14fu   Thyroid disease    Past Surgical History:  Procedure Laterality Date   COLONOSCOPY     HERNIA REPAIR      reports that she has never smoked. She has never used smokeless tobacco. She reports that she does not drink alcohol and does not use  drugs. family history includes Diabetes in her father; Heart disease in her father; Hypertension in her brother; Lung cancer in her mother. No Known Allergies Current Outpatient Medications on File Prior to Visit  Medication Sig Dispense Refill   SYNTHROID 50 MCG tablet TAKE 1 TABLET BY MOUTH DAILY EVERY MONDAY THRU FRIDAY AND 2 TABLETS DAILY ON SATURDAYS AND SUNDAYS 108 tablet 0   No current facility-administered medications on file prior to visit.        ROS:  All others reviewed and negative.  Objective        PE:  BP (!) 142/76    Pulse 77    Temp 98.3 F (36.8 C) (Oral)    Ht 5\' 6"  (1.676 m)    Wt 145 lb (65.8 kg)    SpO2 98%    BMI 23.40 kg/m                 Constitutional: Pt appears in NAD, non toxic               HENT: Head: NCAT.                Right Ear: External ear normal.  Left Ear: External ear normal.                Eyes: . Pupils are equal, round, and reactive to light. Conjunctivae and EOM are normal               Nose: without d/c or deformity               Neck: Neck supple. Gross normal ROM               Cardiovascular: Normal rate and regular rhythm.                 Pulmonary/Chest: Effort normal and breath sounds without rales or wheezing.                Abd:  Soft, mod llq tender, ND, + BS, no organomegaly, no guarding or rebound               Neurological: Pt is alert. At baseline orientation, motor grossly intact               Skin: Skin is warm. No rashes, no other new lesions, LE edema - none               Psychiatric: Pt behavior is normal without agitation   Micro: none  Cardiac tracings I have personally interpreted today:  none  Pertinent Radiological findings (summarize): none   Lab Results  Component Value Date   WBC 11.4 (H) 01/29/2022   HGB 13.5 01/29/2022   HCT 41.0 01/29/2022   PLT 355.0 01/29/2022   GLUCOSE 91 01/29/2022   CHOL 235 (H) 07/30/2021   TRIG 125.0 07/30/2021   HDL 57.50 07/30/2021   LDLCALC 152 (H)  07/30/2021   ALT 58 (H) 01/29/2022   AST 56 (H) 01/29/2022   NA 134 (L) 01/29/2022   K 5.0 01/29/2022   CL 97 01/29/2022   CREATININE 0.86 01/29/2022   BUN 10 01/29/2022   CO2 27 01/29/2022   TSH 2.01 07/30/2021   HGBA1C 6.0 01/29/2022   Assessment/Plan:  Kimberly Tran is a 71 y.o. White or Caucasian [1] female with  has a past medical history of Arthritis, Atypical mole (01/30/2005), Atypical mole (10/21/2006), Atypical mole (02/15/2008), Atypical mole (11/02/2008), Cataract, Hyperlipidemia, Squamous cell carcinoma of skin (05/03/2020), and Thyroid disease.  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  Cathlean Cower, MD 02/03/2022 7:34 AM [facility]

## 2022-01-29 NOTE — Patient Instructions (Signed)
Please take all new medication as prescribed - the 2 antibiotics  Please continue all other medications as before, and refills have been done if requested.  Please have the pharmacy call with any other refills you may need.  Please keep your appointments with your specialists as you may have planned  You will be contacted regarding the referral for: CT scan for the abdomen/pelvis  Please go to the LAB at the blood drawing area for the tests to be done  You will be contacted by phone if any changes need to be made immediately.  Otherwise, you will receive a letter about your results with an explanation, but please check with MyChart first.  Please remember to sign up for MyChart if you have not done so, as this will be important to you in the future with finding out test results, communicating by private email, and scheduling acute appointments online when needed.

## 2022-01-30 LAB — HEPATIC FUNCTION PANEL
ALT: 58 U/L — ABNORMAL HIGH (ref 0–35)
AST: 56 U/L — ABNORMAL HIGH (ref 0–37)
Albumin: 4.2 g/dL (ref 3.5–5.2)
Alkaline Phosphatase: 141 U/L — ABNORMAL HIGH (ref 39–117)
Bilirubin, Direct: 0 mg/dL (ref 0.0–0.3)
Total Bilirubin: 0.7 mg/dL (ref 0.2–1.2)
Total Protein: 8.4 g/dL — ABNORMAL HIGH (ref 6.0–8.3)

## 2022-01-30 LAB — BASIC METABOLIC PANEL
BUN: 10 mg/dL (ref 6–23)
CO2: 27 mEq/L (ref 19–32)
Calcium: 9.4 mg/dL (ref 8.4–10.5)
Chloride: 97 mEq/L (ref 96–112)
Creatinine, Ser: 0.86 mg/dL (ref 0.40–1.20)
GFR: 68.22 mL/min (ref >60.00)
Glucose, Bld: 91 mg/dL (ref 70–99)
Potassium: 5 mEq/L (ref 3.5–5.1)
Sodium: 134 mEq/L — ABNORMAL LOW (ref 135–145)

## 2022-01-30 LAB — LIPASE: Lipase: 9 U/L — ABNORMAL LOW (ref 11.0–59.0)

## 2022-02-03 ENCOUNTER — Encounter: Payer: Self-pay | Admitting: Internal Medicine

## 2022-02-03 NOTE — Assessment & Plan Note (Signed)
Lab Results  Component Value Date   HGBA1C 6.0 01/29/2022   Stable, pt to continue current medical treatment  - diet

## 2022-02-03 NOTE — Assessment & Plan Note (Signed)
BP Readings from Last 3 Encounters:  01/29/22 (!) 142/76  07/30/21 122/70  12/05/20 (!) 160/80   Mild elevated likely situaitonal, pt to continue medical treatment  - diet, wt control

## 2022-02-03 NOTE — Assessment & Plan Note (Signed)
Likely recurrent diverticulitis, but ideally should prove this with lab includiing cbc today, and CT abd/pelvis, also oral cipro/flagyl,  to f/u any worsening symptoms or concerns

## 2022-02-07 ENCOUNTER — Encounter: Payer: Self-pay | Admitting: Internal Medicine

## 2022-02-07 NOTE — Progress Notes (Signed)
Subjective:    Patient ID: Kimberly Tran, female    DOB: 10/17/1951, 71 y.o.   MRN: [id number]  This visit occurred during the SARS-CoV-2 public health emergency.  Safety protocols were in place, including screening questions prior to the visit, additional usage of staff PPE, and extensive cleaning of exam room while observing appropriate contact time as indicated for disinfecting solutions.    HPI The patient is here for an acute visit - no BM in one week.  Seen 2/14 for LLQ pain - presumed diverticulitis - had elevated lfts, elevated wbc.  Took cipro, flagyl.    Two weeks ago - sharp pain across lower abdomen - it went away, but she never felt good after that.  Appetite was low.  Had decrease in BM's.  She continued to have discomfort.  She completed the antibiotics and the discomfort went away.  She wonders if the antibiotics caused constipation-she has not had a bowel movement in 2 weeks, except for a very small one yesterday.  She took a dulcolax Wednesday night and had a very small BM yesterday.  She took another one last night but has not had a BM.  She did pass gas this morning.    Her last good BM was 2/11.  She denies any abdominal pain or discomfort.  She feels okay, but she knows she should be having bowel movements.  She has been eating regularly, but not eating as much.  She has been drinking lots of fluids.  Typically her bowel movements are normal.   Medications and allergies reviewed with patient and updated if appropriate.  Patient Active Problem List   Diagnosis Date Noted   Hyperglycemia 01/29/2022   HLD (hyperlipidemia) 01/29/2022   Blood pressure elevated without history of HTN 01/29/2022   LLQ pain 01/29/2022   Hypothyroid 07/30/2021   Routine general medical examination at a health care facility 07/30/2021    Current Outpatient Medications on File Prior to Visit  Medication Sig Dispense Refill   ciprofloxacin (CIPRO) 500 MG tablet Take 1 tablet (500 mg  total) by mouth 2 (two) times daily for 10 days. 20 tablet 0   metroNIDAZOLE (FLAGYL) 500 MG tablet Take 1 tablet (500 mg total) by mouth 3 (three) times daily for 10 days. 30 tablet 0   SYNTHROID 50 MCG tablet TAKE 1 TABLET BY MOUTH DAILY EVERY MONDAY THRU FRIDAY AND 2 TABLETS DAILY ON SATURDAYS AND SUNDAYS 108 tablet 0   No current facility-administered medications on file prior to visit.    Past Medical History:  Diagnosis Date   Arthritis    fingers   Atypical mole 01/30/2005   slight atypia on abdomen   Atypical mole 10/21/2006   slight to moderate atypia on left calf   Atypical mole 02/15/2008   slight to moderate atypia on right anterior thigh   Atypical mole 11/02/2008   slight atypia on right buttock   Cataract    Hyperlipidemia    Squamous cell carcinoma of skin 05/03/2020   Atypical squamous proliferation-left thigh posterior- cx3, cautery 56fu   Thyroid disease     Past Surgical History:  Procedure Laterality Date   COLONOSCOPY     HERNIA REPAIR      Social History   Socioeconomic History   Marital status: Single    Spouse name: Not on file   Number of children: Not on file   Years of education: Not on file   Highest education level: Not on file  Occupational History   Not on file  Tobacco Use   Smoking status: Never   Smokeless tobacco: Never  Vaping Use   Vaping Use: Never used  Substance and Sexual Activity   Alcohol use: No   Drug use: Never   Sexual activity: Not on file  Other Topics Concern   Not on file  Social History Narrative   Not on file   Social Determinants of Health   Financial Resource Strain: Not on file  Food Insecurity: Not on file  Transportation Needs: Not on file  Physical Activity: Not on file  Stress: Not on file  Social Connections: Not on file    Family History  Problem Relation Age of Onset   Lung cancer Mother    Diabetes Father    Heart disease Father    Hypertension Brother    Colon cancer Neg Hx     Colon polyps Neg Hx    Esophageal cancer Neg Hx    Rectal cancer Neg Hx    Stomach cancer Neg Hx     Review of Systems  Constitutional:  Negative for chills and fever.  Gastrointestinal:  Positive for constipation. Negative for abdominal pain, blood in stool and nausea.      Objective:   Vitals:   02/08/22 0854  BP: 136/84  Pulse: 80  Temp: 97.8 F (36.6 C)  SpO2: 97%   BP Readings from Last 3 Encounters:  02/08/22 136/84  01/29/22 (!) 142/76  07/30/21 122/70   Wt Readings from Last 3 Encounters:  02/08/22 143 lb (64.9 kg)  01/29/22 145 lb (65.8 kg)  07/30/21 142 lb (64.4 kg)   Body mass index is 23.08 kg/m.   Physical Exam Constitutional:      General: She is not in acute distress.    Appearance: Normal appearance. She is not ill-appearing.  HENT:     Head: Normocephalic and atraumatic.  Abdominal:     Palpations: Abdomen is soft. There is no mass.     Tenderness: There is abdominal tenderness (Right lower quadrant and left lower quadrant). There is no guarding or rebound.     Hernia: No hernia is present.  Skin:    General: Skin is warm and dry.  Neurological:     Mental Status: She is alert.        Lab Results  Component Value Date   WBC 6.9 02/08/2022   HGB 13.6 02/08/2022   HCT 40.6 02/08/2022   PLT 469.0 (H) 02/08/2022   GLUCOSE 84 02/08/2022   CHOL 235 (H) 07/30/2021   TRIG 125.0 07/30/2021   HDL 57.50 07/30/2021   LDLCALC 152 (H) 07/30/2021   ALT 35 02/08/2022   AST 26 02/08/2022   NA 134 (L) 02/08/2022   K 3.9 02/08/2022   CL 98 02/08/2022   CREATININE 0.86 02/08/2022   BUN 7 02/08/2022   CO2 33 (H) 02/08/2022   TSH 2.01 07/30/2021   HGBA1C 6.0 01/29/2022    DG Abd 1 View CLINICAL DATA:  Constipation, lower abdominal pain.  EXAM: ABDOMEN - 1 VIEW  COMPARISON:  None.  FINDINGS: The bowel gas pattern is normal. No significant colonic stool burden. Spondylosis.  IMPRESSION: Nonobstructive bowel gas  pattern.  Electronically Signed   By: Dahlia Bailiff M.D.   On: 02/08/2022 10:01     Assessment & Plan:     Constipation, lower abdominal discomfort-right lower quadrant, left lower quadrant: Acute Had a recent episode of diverticulitis and completed Cipro and Flagyl.  Abdominal pain resolved with the antibiotics, but she developed constipation and has not had a good bowel movement in about 2 weeks She did have a small bowel movement yesterday and did pass gas this morning Denies abdominal pain, but on exam she did have tenderness in her right lower quadrant and left lower quadrant Pain may be from constipation, doubt other cause at this time - sounds like diverticulitis was successfully treated KUB today Cbc, cmp Start miralax 2 times daily until good bowel movement Continue increased fluids, good fiber intake  Please call if there is no improvement in your symptoms.

## 2022-02-08 ENCOUNTER — Other Ambulatory Visit: Payer: Self-pay

## 2022-02-08 ENCOUNTER — Telehealth: Payer: Self-pay | Admitting: Internal Medicine

## 2022-02-08 ENCOUNTER — Ambulatory Visit (INDEPENDENT_AMBULATORY_CARE_PROVIDER_SITE_OTHER): Payer: Medicare HMO

## 2022-02-08 ENCOUNTER — Ambulatory Visit (INDEPENDENT_AMBULATORY_CARE_PROVIDER_SITE_OTHER): Payer: Medicare HMO | Admitting: Internal Medicine

## 2022-02-08 ENCOUNTER — Encounter: Payer: Self-pay | Admitting: Internal Medicine

## 2022-02-08 VITALS — BP 136/84 | HR 80 | Temp 97.8°F | Ht 66.0 in | Wt 143.0 lb

## 2022-02-08 DIAGNOSIS — R1030 Lower abdominal pain, unspecified: Secondary | ICD-10-CM

## 2022-02-08 DIAGNOSIS — K5900 Constipation, unspecified: Secondary | ICD-10-CM

## 2022-02-08 LAB — COMPREHENSIVE METABOLIC PANEL
ALT: 35 U/L (ref 0–35)
AST: 26 U/L (ref 0–37)
Albumin: 4.2 g/dL (ref 3.5–5.2)
Alkaline Phosphatase: 100 U/L (ref 39–117)
BUN: 7 mg/dL (ref 6–23)
CO2: 33 mEq/L — ABNORMAL HIGH (ref 19–32)
Calcium: 9.3 mg/dL (ref 8.4–10.5)
Chloride: 98 mEq/L (ref 96–112)
Creatinine, Ser: 0.86 mg/dL (ref 0.40–1.20)
GFR: 68.21 mL/min (ref >60.00)
Glucose, Bld: 84 mg/dL (ref 70–99)
Potassium: 3.9 mEq/L (ref 3.5–5.1)
Sodium: 134 mEq/L — ABNORMAL LOW (ref 135–145)
Total Bilirubin: 0.5 mg/dL (ref 0.2–1.2)
Total Protein: 7.3 g/dL (ref 6.0–8.3)

## 2022-02-08 LAB — CBC WITH DIFFERENTIAL/PLATELET
Basophils Absolute: 0.1 10*3/uL (ref 0.0–0.1)
Basophils Relative: 0.8 % (ref 0.0–3.0)
Eosinophils Absolute: 0.1 10*3/uL (ref 0.0–0.7)
Eosinophils Relative: 1.1 % (ref 0.0–5.0)
HCT: 40.6 % (ref 36.0–46.0)
Hemoglobin: 13.6 g/dL (ref 12.0–15.0)
Lymphocytes Relative: 31.3 % (ref 12.0–46.0)
Lymphs Abs: 2.2 10*3/uL (ref 0.7–4.0)
MCHC: 33.4 g/dL (ref 30.0–36.0)
MCV: 85.7 fl (ref 78.0–100.0)
Monocytes Absolute: 0.8 10*3/uL (ref 0.1–1.0)
Monocytes Relative: 11 % (ref 3.0–12.0)
Neutro Abs: 3.8 10*3/uL (ref 1.4–7.7)
Neutrophils Relative %: 55.8 % (ref 43.0–77.0)
Platelets: 469 10*3/uL — ABNORMAL HIGH (ref 150.0–400.0)
RBC: 4.74 Mil/uL (ref 3.87–5.11)
RDW: 13.1 % (ref 11.5–15.5)
WBC: 6.9 10*3/uL (ref 4.0–10.5)

## 2022-02-08 NOTE — Telephone Encounter (Signed)
Pt checking status of 02-08-2022 xray results, informed pt of provider's 02-08-2022 result notes  Pt has additional question and requesting a c/b

## 2022-02-08 NOTE — Telephone Encounter (Signed)
Questions answered before and my-chart message read.

## 2022-02-08 NOTE — Patient Instructions (Addendum)
° ° °  Have an xray downstairs.   Have blood work downstairs.     Medications changes include :   once we call you with miralax twice daily until you have a good bowel movement.

## 2022-02-14 ENCOUNTER — Telehealth: Payer: Self-pay | Admitting: Internal Medicine

## 2022-02-14 NOTE — Telephone Encounter (Signed)
Pt was seen 02-08-2022 for constipation, pt states she is still constipated, pts taking mirilax w/ little relief ? ?Pt requesting a c/b for recommendations ?

## 2022-02-15 NOTE — Telephone Encounter (Signed)
I don't see enough clinical info to make decision. She was advised to take miralax BID is she doing this? Is she having any BM, if she how often? Is she having any abdominal pain, nausea, vomiting? ?

## 2022-02-19 NOTE — Telephone Encounter (Signed)
Unable to get in contact with the pt. LVM to call our office  ?

## 2022-02-20 ENCOUNTER — Ambulatory Visit
Admission: RE | Admit: 2022-02-20 | Discharge: 2022-02-20 | Disposition: A | Discharge status: Home / Self Care | Payer: Medicare HMO | Source: Ambulatory Visit | Attending: Internal Medicine | Admitting: Internal Medicine

## 2022-02-20 ENCOUNTER — Other Ambulatory Visit: Payer: Self-pay

## 2022-02-20 DIAGNOSIS — R1032 Left lower quadrant pain: Secondary | ICD-10-CM

## 2022-02-20 MED ORDER — IOPAMIDOL (ISOVUE-300) INJECTION 61%
100.0000 mL | Freq: Once | INTRAVENOUS | Status: AC | PRN
  Administered 2022-02-20: 100 mL via INTRAVENOUS

## 2022-03-27 ENCOUNTER — Ambulatory Visit: Payer: Medicare HMO | Admitting: Dermatology

## 2022-03-27 DIAGNOSIS — Z86018 Personal history of other benign neoplasm: Secondary | ICD-10-CM

## 2022-03-27 DIAGNOSIS — L821 Other seborrheic keratosis: Secondary | ICD-10-CM

## 2022-03-27 DIAGNOSIS — L84 Corns and callosities: Secondary | ICD-10-CM | POA: Diagnosis not present

## 2022-03-27 DIAGNOSIS — Z1283 Encounter for screening for malignant neoplasm of skin: Secondary | ICD-10-CM | POA: Diagnosis not present

## 2022-03-27 DIAGNOSIS — Z85828 Personal history of other malignant neoplasm of skin: Secondary | ICD-10-CM

## 2022-03-27 DIAGNOSIS — L820 Inflamed seborrheic keratosis: Secondary | ICD-10-CM | POA: Diagnosis not present

## 2022-04-03 ENCOUNTER — Ambulatory Visit (INDEPENDENT_AMBULATORY_CARE_PROVIDER_SITE_OTHER): Payer: Medicare HMO

## 2022-04-03 DIAGNOSIS — Z0000 Encounter for general adult medical examination without abnormal findings: Secondary | ICD-10-CM

## 2022-04-03 NOTE — Patient Instructions (Signed)
Kimberly Tran , ?Thank you for taking time to come for your Medicare Wellness Visit. I appreciate your ongoing commitment to your health goals. Please review the following plan we discussed and let me know if I can assist you in the future.  ? ?Screening recommendations/referrals: ?Colonoscopy: 07/21/2018; due every 10 years ?Mammogram: 10/02/2021 ?Bone Density: no record on file ?Recommended yearly ophthalmology/optometry visit for glaucoma screening and checkup ?Recommended yearly dental visit for hygiene and checkup ? ?Vaccinations: ?Influenza vaccine: declined ?Pneumococcal vaccine: 11/24/2019, 07/30/2021 ?Tdap vaccine: 01/16/2021; due every 10 years ?Shingles vaccine: declined   ?Covid-19: declined ? ?Advanced directives: Yes; Please bring a copy of your health care power of attorney and living will to the office at your convenience. ? ?Conditions/risks identified: Yes ? ?Next appointment: Please schedule your next Medicare Wellness Visit with your Nurse Health Advisor in 1 year by calling [phone]. ? ? ?Preventive Care 69 Years and Older, Female ?Preventive care refers to lifestyle choices and visits with your health care provider that can promote health and wellness. ?What does preventive care include? ?A yearly physical exam. This is also called an annual well check. ?Dental exams once or twice a year. ?Routine eye exams. Ask your health care provider how often you should have your eyes checked. ?Personal lifestyle choices, including: ?Daily care of your teeth and gums. ?Regular physical activity. ?Eating a healthy diet. ?Avoiding tobacco and drug use. ?Limiting alcohol use. ?Practicing safe sex. ?Taking low-dose aspirin every day. ?Taking vitamin and mineral supplements as recommended by your health care provider. ?What happens during an annual well check? ?The services and screenings done by your health care provider during your annual well check will depend on your age, overall health, lifestyle risk factors,  and family history of disease. ?Counseling  ?Your health care provider may ask you questions about your: ?Alcohol use. ?Tobacco use. ?Drug use. ?Emotional well-being. ?Home and relationship well-being. ?Sexual activity. ?Eating habits. ?History of falls. ?Memory and ability to understand (cognition). ?Work and work Statistician. ?Reproductive health. ?Screening  ?You may have the following tests or measurements: ?Height, weight, and BMI. ?Blood pressure. ?Lipid and cholesterol levels. These may be checked every 5 years, or more frequently if you are over 19 years old. ?Skin check. ?Lung cancer screening. You may have this screening every year starting at age 68 if you have a 30-pack-year history of smoking and currently smoke or have quit within the past 15 years. ?Fecal occult blood test (FOBT) of the stool. You may have this test every year starting at age 3. ?Flexible sigmoidoscopy or colonoscopy. You may have a sigmoidoscopy every 5 years or a colonoscopy every 10 years starting at age 34. ?Hepatitis C blood test. ?Hepatitis B blood test. ?Sexually transmitted disease (STD) testing. ?Diabetes screening. This is done by checking your blood sugar (glucose) after you have not eaten for a while (fasting). You may have this done every 1-3 years. ?Bone density scan. This is done to screen for osteoporosis. You may have this done starting at age 60. ?Mammogram. This may be done every 1-2 years. Talk to your health care provider about how often you should have regular mammograms. ?Talk with your health care provider about your test results, treatment options, and if necessary, the need for more tests. ?Vaccines  ?Your health care provider may recommend certain vaccines, such as: ?Influenza vaccine. This is recommended every year. ?Tetanus, diphtheria, and acellular pertussis (Tdap, Td) vaccine. You may need a Td booster every 10 years. ?Zoster vaccine. You may  need this after age 71. ?Pneumococcal 13-valent conjugate  (PCV13) vaccine. One dose is recommended after age 15. ?Pneumococcal polysaccharide (PPSV23) vaccine. One dose is recommended after age 78. ?Talk to your health care provider about which screenings and vaccines you need and how often you need them. ?This information is not intended to replace advice given to you by your health care provider. Make sure you discuss any questions you have with your health care provider. ?Document Released: 12/29/2015 Document Revised: 08/21/2016 Document Reviewed: 10/03/2015 ?Elsevier Interactive Patient Education ? 2017 [place]. ? ?Fall Prevention in the Home ?Falls can cause injuries. They can happen to people of all ages. There are many things you can do to make your home safe and to help prevent falls. ?What can I do on the outside of my home? ?Regularly fix the edges of walkways and driveways and fix any cracks. ?Remove anything that might make you trip as you walk through a door, such as a raised step or threshold. ?Trim any bushes or trees on the path to your home. ?Use bright outdoor lighting. ?Clear any walking paths of anything that might make someone trip, such as rocks or tools. ?Regularly check to see if handrails are loose or broken. Make sure that both sides of any steps have handrails. ?Any raised decks and porches should have guardrails on the edges. ?Have any leaves, snow, or ice cleared regularly. ?Use sand or salt on walking paths during winter. ?Clean up any spills in your garage right away. This includes oil or grease spills. ?What can I do in the bathroom? ?Use night lights. ?Install grab bars by the toilet and in the tub and shower. Do not use towel bars as grab bars. ?Use non-skid mats or decals in the tub or shower. ?If you need to sit down in the shower, use a plastic, non-slip stool. ?Keep the floor dry. Clean up any water that spills on the floor as soon as it happens. ?Remove soap buildup in the tub or shower regularly. ?Attach bath mats securely with  double-sided non-slip rug tape. ?Do not have throw rugs and other things on the floor that can make you trip. ?What can I do in the bedroom? ?Use night lights. ?Make sure that you have a light by your bed that is easy to reach. ?Do not use any sheets or blankets that are too big for your bed. They should not hang down onto the floor. ?Have a firm chair that has side arms. You can use this for support while you get dressed. ?Do not have throw rugs and other things on the floor that can make you trip. ?What can I do in the kitchen? ?Clean up any spills right away. ?Avoid walking on wet floors. ?Keep items that you use a lot in easy-to-reach places. ?If you need to reach something above you, use a strong step stool that has a grab bar. ?Keep electrical cords out of the way. ?Do not use floor polish or wax that makes floors slippery. If you must use wax, use non-skid floor wax. ?Do not have throw rugs and other things on the floor that can make you trip. ?What can I do with my stairs? ?Do not leave any items on the stairs. ?Make sure that there are handrails on both sides of the stairs and use them. Fix handrails that are broken or loose. Make sure that handrails are as long as the stairways. ?Check any carpeting to make sure that it is firmly attached  to the stairs. Fix any carpet that is loose or worn. ?Avoid having throw rugs at the top or bottom of the stairs. If you do have throw rugs, attach them to the floor with carpet tape. ?Make sure that you have a light switch at the top of the stairs and the bottom of the stairs. If you do not have them, ask someone to add them for you. ?What else can I do to help prevent falls? ?Wear shoes that: ?Do not have high heels. ?Have rubber bottoms. ?Are comfortable and fit you well. ?Are closed at the toe. Do not wear sandals. ?If you use a stepladder: ?Make sure that it is fully opened. Do not climb a closed stepladder. ?Make sure that both sides of the stepladder are locked  into place. ?Ask someone to hold it for you, if possible. ?Clearly mark and make sure that you can see: ?Any grab bars or handrails. ?First and last steps. ?Where the edge of each step is. ?Use tools that

## 2022-04-03 NOTE — Progress Notes (Signed)
?I connected with Kimberly Tran today by telephone and verified that I am speaking with the correct person using two identifiers. ?Location patient: home ?Location provider: work ?Persons participating in the virtual visit: patient, provider. ?  ?I discussed the limitations, risks, security and privacy concerns of performing an evaluation and management service by telephone and the availability of in person appointments. I also discussed with the patient that there may be a patient responsible charge related to this service. The patient expressed understanding and verbally consented to this telephonic visit.  ?  ?Interactive audio and video telecommunications were attempted between this provider and patient, however failed, due to patient having technical difficulties OR patient did not have access to video capability.  We continued and completed visit with audio only. ? ?Some vital signs may be absent or patient reported.  ? ?Time Spent with patient on telephone encounter: 30 minutes ? ?Subjective:  ? Kimberly Tran is a 71 y.o. female who presents for Medicare Annual (Subsequent) preventive examination. ? ?Review of Systems    ? ?Cardiac Risk Factors include: advanced age (>67mn, >>15women);dyslipidemia;family history of premature cardiovascular disease ? ?   ?Objective:  ?  ?There were no vitals filed for this visit. ?There is no height or weight on file to calculate BMI. ? ? ?  04/03/2022  ?  2:03 PM  ?Advanced Directives  ?Does Patient Have a Medical Advance Directive? Yes  ?Type of Advance Directive Living will;Healthcare Power of Attorney  ?Does patient want to make changes to medical advance directive? No - Patient declined  ?[place]? No - copy requested  ? ? ?Current Medications (verified) ?Outpatient Encounter Medications as of 04/03/2022  ?Medication Sig  ? SYNTHROID 50 MCG tablet TAKE 1 TABLET BY MOUTH DAILY EVERY MONDAY THRU FRIDAY AND 2 TABLETS DAILY ON  SATURDAYS AND SUNDAYS  ? ?No facility-administered encounter medications on file as of 04/03/2022.  ? ? ?Allergies (verified) ?Patient has no known allergies.  ? ?History: ?Past Medical History:  ?Diagnosis Date  ? 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 Atypical mole 01/30/2005  ? slight atypia on abdomen  ? Atypical mole 10/21/2006  ? slight to moderate atypia on left calf  ? Atypical mole 02/15/2008  ? slight to moderate atypia on right anterior thigh  ? Atypical mole 11/02/2008  ? slight atypia on right buttock  ? Cataract   ? Hyperlipidemia   ? 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 Atypical squamous proliferation-left thigh posterior- cx3, cautery 510f ? Thyroid disease   ? ?Past Surgical History:  ?Procedure Laterality Date  ? COLONOSCOPY    ? HERNIA REPAIR    ? ?Family History  ?Problem Relation Age of Onset  ? Lung cancer Mother   ? Diabetes Father   ? Heart disease Father   ? Hypertension Brother   ? Colon cancer Neg Hx   ? Colon polyps Neg Hx   ? Esophageal cancer Neg Hx   ? Rectal cancer Neg Hx   ? Stomach cancer Neg Hx   ? ?Social History  ? ?Socioeconomic History  ? Marital status: Single  ?  Spouse name: Not on file  ? Number of children: Not on file  ? Years of education: Not on file  ? Highest education level: Not on file  ?Occupational History  ? Not on file  ?Tobacco Use  ? Smoking status: Never  ? Smokeless tobacco: Never  ?Vaping Use  ?  Vaping Use: Never used  ?Substance and Sexual Activity  ? Alcohol use: No  ? Drug use: Never  ? Sexual activity: Not on file  ?Other Topics Concern  ? Not on file  ?Social History Narrative  ? Not on file  ? ?Social Determinants of Health  ? ?Financial Resource Strain: Low Risk   ? Difficulty of Paying Living Expenses: Not hard at all  ?Food Insecurity: No Food Insecurity  ? Worried About Charity fundraiser in the Last Year: Never true  ? Ran Out of Food in the Last Year: Never true  ?Transportation Needs: No Transportation Needs  ? Lack of Transportation (Medical): No   ? Lack of Transportation (Non-Medical): No  ?Physical Activity: Sufficiently Active  ? Days of Exercise per Week: 5 days  ? Minutes of Exercise per Session: 30 min  ?Stress: No Stress Concern Present  ? Feeling of Stress : Not at all  ?Social Connections: Unknown  ? Frequency of Communication with Friends and Family: More than three times a week  ? Frequency of Social Gatherings with Friends and Family: More than three times a week  ? Attends Religious Services: Patient refused  ? Active Member of Clubs or Organizations: Patient refused  ? Attends Archivist Meetings: Patient refused  ? Marital Status: Never married  ? ? ?Tobacco Counseling ?Counseling given: Not Answered ? ? ?Clinical Intake: ? ?Pre-visit preparation completed: Yes ? ?Pain : No/denies pain ? ?  ? ?Nutritional Risks: None ?Diabetes: No ? ?How often do you need to have someone help you when you read instructions, pamphlets, or other written materials from your doctor or pharmacy?: 1 - Never ?What is the last grade level you completed in school?: [employer] ? ?Diabetic?no ? ?Interpreter Needed?: No ? ?Information entered by :: Lisette Abu, LPN ? ? ?Activities of Daily Living ? ?  04/03/2022  ?  2:11 PM 07/30/2021  ?  9:07 AM  ?In your present state of health, do you have any difficulty performing the following activities:  ?Hearing? 0 0  ?Vision? 0 0  ?Difficulty concentrating or making decisions? 0 0  ?Walking or climbing stairs? 0 0  ?Dressing or bathing? 0 0  ?Doing errands, shopping? 0 0  ?Preparing Food and eating ? N   ?Using the Toilet? N   ?In the past six months, have you accidently leaked urine? N   ?Do you have problems with loss of bowel control? N   ?Managing your Medications? N   ?Managing your Finances? N   ?Housekeeping or managing your Housekeeping? N   ? ? ?Patient Care Team: ?Hoyt Koch, MD as PCP - General (Internal Medicine) ?Lavonna Monarch, MD as Consulting Physician (Dermatology) ? ?Indicate any  recent Medical Services you may have received from other than [facility] providers in the past year (date may be approximate). ? ?   ?Assessment:  ? This is a routine wellness examination for Kimberly Tran. ? ?Hearing/Vision screen ?Hearing Screening - Comments:: Patient denied any hearing difficulty.   ?No hearing aids. ? ?Vision Screening - Comments:: Patient does not wear any corrective lenses/contacts.   ? ? ? ?Dietary issues and exercise activities discussed: ?Current Exercise Habits: Home exercise routine, Type of exercise: walking, Time (Minutes): 30, Frequency (Times/Week): 5, Weekly Exercise (Minutes/Week): 150, Intensity: Moderate, Exercise limited by: None identified ? ? Goals Addressed   ? ?  ?  ?  ?  ? This Visit's Progress  ?  To lose 10 more pounds.     ? ?  ?  Depression Screen ? ?  04/03/2022  ?  2:09 PM 02/08/2022  ?  9:02 AM 01/29/2022  ? 10:12 AM 07/30/2021  ?  9:06 AM 11/24/2019  ? 12:08 PM 02/25/2017  ?  5:31 PM 02/19/2017  ?  9:40 AM  ?PHQ 2/9 Scores  ?PHQ - 2 Score 0 0 0 0 0 0 0  ?  ?Fall Risk ? ?  04/03/2022  ?  2:04 PM 02/08/2022  ?  9:02 AM 01/29/2022  ? 10:12 AM 07/30/2021  ?  9:07 AM 07/24/2020  ?  9:11 AM  ?Fall Risk   ?Falls in the past year? 0 0 0 0 0  ?Number falls in past yr: 0   0 0  ?Injury with Fall? 0   0 0  ?Risk for fall due to : No Fall Risks    No Fall Risks  ?Follow up Falls evaluation completed      ? ? ?FALL RISK PREVENTION PERTAINING TO THE HOME: ? ?Any stairs in or around the home? No  ?If so, are there any without handrails? No  ?Home free of loose throw rugs in walkways, pet beds, electrical cords, etc? Yes  ?Adequate lighting in your home to reduce risk of falls? Yes  ? ?ASSISTIVE DEVICES UTILIZED TO PREVENT FALLS: ? ?Life alert? No  ?Use of a cane, walker or w/c? No  ?Grab bars in the bathroom? Yes  ?Shower chair or bench in shower? No  ?Elevated toilet seat or a handicapped toilet? No  ? ?TIMED UP AND GO: ? ?Was the test performed? No .  ?Length of time to ambulate 10 feet: n/a sec.   ? ?Appearance of gait: Patient not evaluated for gait during this visit. ? ?Cognitive Function: ?  ?  ? ?  04/03/2022  ?  2:13 PM  ?6CIT Screen  ?What Year? 0 points  ?What month? 0 points  ?What time? 0 points  ?Count

## 2022-04-14 ENCOUNTER — Encounter: Payer: Self-pay | Admitting: Dermatology

## 2022-04-14 NOTE — Progress Notes (Signed)
? ?  Follow-Up Visit ?  ?Subjective  ?Kimberly Tran is a 71 y.o. female who presents for the following: Skin Problem (Lesion on buttocks x 3 years. Lesion on back x years. Left foot has a corn.  Personal history of scc and atypical moles.). ? ?Still having pain with bilateral foot, itchy keratoses, check other spots. ?Location:  ?Duration:  ?Quality:  ?Associated Signs/Symptoms: ?Modifying Factors:  ?Severity:  ?Timing: ?Context:  ? ?Objective  ?Well appearing patient in no apparent distress; mood and affect are within normal limits. ?No sign of new or recurrent skin cancer. ? ?Right 5th Metatarsal Plantar Area ?Right fifth metatarsal clavus still causing pain ? ?Left Thigh - Posterior ?Several keratoses historically H/staying ? ? ? ?All sun exposed areas plus back examined.  Plus legs, feet. ? ? ?Assessment & Plan  ? ? ?Corns and callosities ?Right 5th Metatarsal Plantar Area ? ?Lesion deeply pared and dichloroacetic acid applied under tape. ? ?Encounter for screening for malignant neoplasm of skin ? ?Inflamed seborrheic keratosis ?Left Thigh - Posterior ? ?Destruction of lesion - Left Thigh - Posterior ?Complexity: simple   ?Destruction method: cryotherapy   ?Informed consent: discussed and consent obtained   ?Timeout:  patient name, date of birth, surgical site, and procedure verified ?Lesion destroyed using liquid nitrogen: Yes   ?Cryotherapy cycles:  3 ?Outcome: patient tolerated procedure well with no complications   ? ? ? ? ? ?I, Lavonna Monarch, MD, have reviewed all documentation for this visit.  The documentation on 04/14/22 for the exam, diagnosis, procedures, and orders are all accurate and complete. ?

## 2022-05-16 ENCOUNTER — Encounter: Payer: Self-pay | Admitting: Gastroenterology

## 2022-05-16 ENCOUNTER — Encounter: Payer: Self-pay | Admitting: Family Medicine

## 2022-05-16 ENCOUNTER — Ambulatory Visit (INDEPENDENT_AMBULATORY_CARE_PROVIDER_SITE_OTHER): Payer: Medicare HMO | Admitting: Family Medicine

## 2022-05-16 VITALS — BP 150/82 | HR 70 | Temp 97.8°F | Ht 66.0 in | Wt 140.0 lb

## 2022-05-16 DIAGNOSIS — R10814 Left lower quadrant abdominal tenderness: Secondary | ICD-10-CM | POA: Insufficient documentation

## 2022-05-16 DIAGNOSIS — Z8719 Personal history of other diseases of the digestive system: Secondary | ICD-10-CM

## 2022-05-16 DIAGNOSIS — R1030 Lower abdominal pain, unspecified: Secondary | ICD-10-CM | POA: Diagnosis not present

## 2022-05-16 DIAGNOSIS — R109 Unspecified abdominal pain: Secondary | ICD-10-CM | POA: Diagnosis not present

## 2022-05-16 LAB — COMPREHENSIVE METABOLIC PANEL
ALT: 27 U/L (ref 0–35)
AST: 24 U/L (ref 0–37)
Albumin: 4.3 g/dL (ref 3.5–5.2)
Alkaline Phosphatase: 94 U/L (ref 39–117)
BUN: 11 mg/dL (ref 6–23)
CO2: 29 mEq/L (ref 19–32)
Calcium: 9.7 mg/dL (ref 8.4–10.5)
Chloride: 100 mEq/L (ref 96–112)
Creatinine, Ser: 0.8 mg/dL (ref 0.40–1.20)
GFR: 74.25 mL/min (ref >60.00)
Glucose, Bld: 95 mg/dL (ref 70–99)
Potassium: 4.6 mEq/L (ref 3.5–5.1)
Sodium: 137 mEq/L (ref 135–145)
Total Bilirubin: 0.7 mg/dL (ref 0.2–1.2)
Total Protein: 7.4 g/dL (ref 6.0–8.3)

## 2022-05-16 LAB — POCT URINALYSIS DIPSTICK
Bilirubin, UA: NEGATIVE
Blood, UA: NEGATIVE
Glucose, UA: NEGATIVE
Ketones, UA: NEGATIVE
Leukocytes, UA: NEGATIVE
Nitrite, UA: NEGATIVE
Protein, UA: NEGATIVE
Spec Grav, UA: 1.01 (ref 1.010–1.025)
Urobilinogen, UA: 0.2 E.U./dL
pH, UA: 6.5 (ref 5.0–8.0)

## 2022-05-16 LAB — CBC WITH DIFFERENTIAL/PLATELET
Basophils Absolute: 0.1 10*3/uL (ref 0.0–0.1)
Basophils Relative: 1.3 % (ref 0.0–3.0)
Eosinophils Absolute: 0.1 10*3/uL (ref 0.0–0.7)
Eosinophils Relative: 1.4 % (ref 0.0–5.0)
HCT: 41.4 % (ref 36.0–46.0)
Hemoglobin: 13.9 g/dL (ref 12.0–15.0)
Lymphocytes Relative: 31.9 % (ref 12.0–46.0)
Lymphs Abs: 2.1 10*3/uL (ref 0.7–4.0)
MCHC: 33.6 g/dL (ref 30.0–36.0)
MCV: 87 fl (ref 78.0–100.0)
Monocytes Absolute: 0.5 10*3/uL (ref 0.1–1.0)
Monocytes Relative: 7 % (ref 3.0–12.0)
Neutro Abs: 3.8 10*3/uL (ref 1.4–7.7)
Neutrophils Relative %: 58.4 % (ref 43.0–77.0)
Platelets: 367 10*3/uL (ref 150.0–400.0)
RBC: 4.76 Mil/uL (ref 3.87–5.11)
RDW: 13.7 % (ref 11.5–15.5)
WBC: 6.6 10*3/uL (ref 4.0–10.5)

## 2022-05-16 MED ORDER — AMOXICILLIN-POT CLAVULANATE 875-125 MG PO TABS: 1.0000 | ORAL_TABLET | Freq: Two times a day (BID) | ORAL | 0 refills | Status: DC

## 2022-05-16 NOTE — Assessment & Plan Note (Signed)
Review of previous notes regarding abdominal pain and diverticulitis.  Reviewed CT results from March 2023.  Reviewed gastroenterology notes from 2019 and colonoscopy report. Urinalysis dipstick negative.  Urine culture sent. Suspect recurrent diverticulitis.  She declines Cipro and Flagyl therapy.  Reports constipation related to these medications in February 2023. Augmentin prescribed. Referral to GI due to recurrent suspected diverticulitis with history of diverticula on colonoscopy and recent CT.

## 2022-05-16 NOTE — Patient Instructions (Signed)
Please go to the first floor for labs before you leave today  Take the antibiotic as prescribed with food.  I have put in an urgent referral to gastroenterology.  You should hear from [place] soon.  We will be in touch with your lab results.  If you are getting significantly worse in spite of being on the antibiotic, then you should go to the emergency department for further evaluation.

## 2022-05-16 NOTE — Progress Notes (Signed)
Subjective:     Patient ID: Kimberly Tran, female    DOB: [birth year], 71 y.o.   MRN: [id number]  Chief Complaint  Patient presents with   Back Pain    Thinks she has diverticulitis, had it this past Febuary. Sharp lower back pain starting last night and could not lay on her left side.    Abdominal Pain    States stomach doesn't feel normal as it should    HPI Patient is in today for sharp pain in left flank that started last night. States she thinks she has diverticulitis again.   States she was treated for diverticulitis in February 2023 and symptoms resolved with Cipro and Flagyl.  States she does not want to take these antibiotics again if possible because of constipation after taking the antibiotics.  Denies fever, chills, dizziness, chest pain, palpitations, shortness of breath, abdominal pain, nausea, vomiting or diarrhea.  Denies urinary symptoms.  Denies history of renal stone. Denies hematuria or blood in stool.  Last bowel movement was this morning and normal.   Colonoscopy in 2019 showed diverticula as well as recent CT abdomen done in March.  Colonoscopy done at [facility].  10-year recall  Health Maintenance Due  Topic Date Due   Hepatitis C Screening  Never done   Zoster Vaccines- Shingrix (1 of 2) Never done    Past Medical History:  Diagnosis Date   Arthritis    fingers   Atypical mole 01/30/2005   slight atypia on abdomen   Atypical mole 10/21/2006   slight to moderate atypia on left calf   Atypical mole 02/15/2008   slight to moderate atypia on right anterior thigh   Atypical mole 11/02/2008   slight atypia on right buttock   Cataract    Hyperlipidemia    Squamous cell carcinoma of skin 05/03/2020   Atypical squamous proliferation-left thigh posterior- cx3, cautery 64f   Thyroid disease     Past Surgical History:  Procedure Laterality Date   COLONOSCOPY     HERNIA REPAIR      Family History  Problem Relation Age of Onset   Lung cancer  Mother    Diabetes Father    Heart disease Father    Hypertension Brother    Colon cancer Neg Hx    Colon polyps Neg Hx    Esophageal cancer Neg Hx    Rectal cancer Neg Hx    Stomach cancer Neg Hx     Social History   Socioeconomic History   Marital status: Single    Spouse name: Not on file   Number of children: Not on file   Years of education: Not on file   Highest education level: Not on file  Occupational History   Not on file  Tobacco Use   Smoking status: Never   Smokeless tobacco: Never  Vaping Use   Vaping Use: Never used  Substance and Sexual Activity   Alcohol use: No   Drug use: Never   Sexual activity: Not on file  Other Topics Concern   Not on file  Social History Narrative   Not on file   Social Determinants of Health   Financial Resource Strain: Low Risk    Difficulty of Paying Living Expenses: Not hard at all  Food Insecurity: No Food Insecurity   Worried About RCharity fundraiserin the Last Year: Never true   Ran Out of Food in the Last Year: Never true  Transportation Needs: No Transportation  Needs   Lack of Transportation (Medical): No   Lack of Transportation (Non-Medical): No  Physical Activity: Sufficiently Active   Days of Exercise per Week: 5 days   Minutes of Exercise per Session: 30 min  Stress: No Stress Concern Present   Feeling of Stress : Not at all  Social Connections: Unknown   Frequency of Communication with Friends and Family: More than three times a week   Frequency of Social Gatherings with Friends and Family: More than three times a week   Attends Religious Services: Patient refused   Marine scientist or Organizations: Patient refused   Attends Music therapist: Patient refused   Marital Status: Never married  Human resources officer Violence: Not At Risk   Fear of Current or Ex-Partner: No   Emotionally Abused: No   Physically Abused: No   Sexually Abused: No    Outpatient Medications Prior to Visit   Medication Sig Dispense Refill   SYNTHROID 50 MCG tablet TAKE 1 TABLET BY MOUTH DAILY EVERY MONDAY THRU FRIDAY AND 2 TABLETS DAILY ON SATURDAYS AND SUNDAYS 108 tablet 0   No facility-administered medications prior to visit.    No Known Allergies  ROS Pertinent positives and negatives in the history of present illness.     Objective:    Physical Exam Constitutional:      General: She is not in acute distress.    Appearance: She is not ill-appearing.  HENT:     Mouth/Throat:     Mouth: Mucous membranes are moist.  Eyes:     General: No scleral icterus.    Extraocular Movements: Extraocular movements intact.     Pupils: Pupils are equal, round, and reactive to light.  Cardiovascular:     Rate and Rhythm: Normal rate and regular rhythm.     Heart sounds: Normal heart sounds.  Pulmonary:     Effort: Pulmonary effort is normal.     Breath sounds: Normal breath sounds.  Abdominal:     General: Bowel sounds are increased.     Palpations: Abdomen is soft. There is no hepatomegaly or splenomegaly.     Tenderness: There is abdominal tenderness in the suprapubic area and left lower quadrant. There is no right CVA tenderness, left CVA tenderness, guarding or rebound. Negative signs include psoas sign.  Skin:    General: Skin is warm and dry.  Neurological:     General: No focal deficit present.     Mental Status: She is alert and oriented to person, place, and time.  Psychiatric:        Mood and Affect: Mood normal.    BP (!) 150/82 (BP Location: Left Arm, Patient Position: Sitting, Cuff Size: Large)   Pulse 70   Temp 97.8 F (36.6 C) (Temporal)   Ht '5\' 6"'$  (1.676 m)   Wt 140 lb (63.5 kg)   SpO2 97%   BMI 22.60 kg/m  Wt Readings from Last 3 Encounters:  05/16/22 140 lb (63.5 kg)  02/08/22 143 lb (64.9 kg)  01/29/22 145 lb (65.8 kg)       Assessment & Plan:   Problem List Items Addressed This Visit       Other   Hx of diverticulitis of colon   Relevant  Medications   amoxicillin-clavulanate (AUGMENTIN) 875-125 MG tablet   Other Relevant Orders   Ambulatory referral to Gastroenterology   Left flank pain - Primary   Relevant Orders   POCT urinalysis dipstick (Completed)   Urine Culture  Left lower quadrant abdominal tenderness without rebound tenderness   Relevant Medications   amoxicillin-clavulanate (AUGMENTIN) 875-125 MG tablet   Other Relevant Orders   Urine Culture   CBC with Differential/Platelet   Comprehensive metabolic panel   Ambulatory referral to Gastroenterology   Lower abdominal pain    Review of previous notes regarding abdominal pain and diverticulitis.  Reviewed CT results from March 2023.  Reviewed gastroenterology notes from 2019 and colonoscopy report. Urinalysis dipstick negative.  Urine culture sent. Suspect recurrent diverticulitis.  She declines Cipro and Flagyl therapy.  Reports constipation related to these medications in February 2023. Augmentin prescribed. Referral to GI due to recurrent suspected diverticulitis with history of diverticula on colonoscopy and recent CT.        Relevant Medications   amoxicillin-clavulanate (AUGMENTIN) 875-125 MG tablet   Other Relevant Orders   CBC with Differential/Platelet   Comprehensive metabolic panel   Ambulatory referral to Gastroenterology   Visit time 22 minutes in face to face communication with patient and coordination of care, additional 12 minutes spent in record review, coordination or care, ordering tests, communicating/referring to other healthcare professionals, documenting in medical records all on the same day of the visit for total time 34 minutes spent on the visit.    I am having Bill Salinas start on amoxicillin-clavulanate. I am also having her maintain her Synthroid.  Meds ordered this encounter  Medications   amoxicillin-clavulanate (AUGMENTIN) 875-125 MG tablet    Sig: Take 1 tablet by mouth 2 (two) times daily.    Dispense:  14  tablet    Refill:  0    Order Specific Question:   Supervising Provider    Answer:   Pricilla Holm A [[id number]]

## 2022-05-17 LAB — URINE CULTURE: Result:: NO GROWTH

## 2022-05-27 ENCOUNTER — Other Ambulatory Visit: Payer: Self-pay

## 2022-05-28 ENCOUNTER — Encounter: Payer: Self-pay | Admitting: Gastroenterology

## 2022-05-28 ENCOUNTER — Ambulatory Visit: Payer: Medicare HMO | Admitting: Gastroenterology

## 2022-05-28 VITALS — BP 144/90 | HR 63 | Resp 14 | Ht 65.5 in | Wt 141.6 lb

## 2022-05-28 DIAGNOSIS — R1030 Lower abdominal pain, unspecified: Secondary | ICD-10-CM | POA: Diagnosis not present

## 2022-05-28 DIAGNOSIS — R109 Unspecified abdominal pain: Secondary | ICD-10-CM | POA: Diagnosis not present

## 2022-05-28 NOTE — Patient Instructions (Signed)
If you are age 71 or older, your body mass index should be between 23-30. Your Body mass index is 23.21 kg/m. If this is out of the aforementioned range listed, please consider follow up with your Primary Care Provider.  If you are age 48 or younger, your body mass index should be between 19-25. Your Body mass index is 23.21 kg/m. If this is out of the aformentioned range listed, please consider follow up with your Primary Care Provider.   ________________________________________________________  The [facility] providers would like to encourage you to use [facility] to communicate with providers for non-urgent requests or questions.  Due to long hold times on the telephone, sending your provider a message by [facility] may be a faster and more efficient way to get a response.  Please allow 48 business hours for a response.  Please remember that this is for non-urgent requests.  _______________________________________________________  It was a pleasure to see you today!  Thank you for trusting me with your gastrointestinal care!

## 2022-05-28 NOTE — Progress Notes (Signed)
[facility] Consult Note:  History: Kimberly Tran 05/28/2022  Referring provider: Hoyt Koch, MD  Reason for consult/chief complaint: Referral (Pt states she was referred by PCP due to 2 episodes of diverticulitis this year. Pt states she just finished Amoxicillin approx 2 weeks ago. Pt denies any current symptoms. )   Subjective  HPI: Kimberly Tran was referred by primary care for recurrent left lower quadrant pain and concerns of diverticulitis.  She was treated for presumed diverticulitis in late 2020, saw primary care February of this year for recurrent left lower quadrant pain. She was treated empirically for diverticulitis with ciprofloxacin and Flagyl.  Seen by PCP again about 10 days later reporting improvement in symptoms with antibiotics, but then recurrence of pain with over a week of constipation treated with MiraLAX.(Which gave little relief) Phone notes indicate she was still not feeling well afterward, abdominal CT Tran about a week afterward did not show diverticulitis (report below)  Saw primary care team first with recurrence of similar symptoms, declined Cipro and Flagyl because of previous constipation developing afterward.  She was treated with Augmentin and referred to see [place].  Kimberly Tran describes the early March episode as an acute onset sharp lower abdominal pain that lasted several minutes.  She then had some pain in the left flank and had no further abdominal pain, but felt "generally unwell" in the lower abdomen.  She is not able to characterize any further and her bowel habits were regular without bleeding.  When she went to primary care a few days later she got antibiotics and felt better within about a day or 2. She was well until the recent episode, which was a left flank pain that would hurt more if she turns or lay on that side, and also generally "felt unwell".  She was again improved within a couple of days on  antibiotics.  Screening colonoscopy with me August 2019: Complete exam, good prep, no polyps, left-sided diverticulosis. ROS:  Review of Systems   Past Medical History: Past Medical History:  Diagnosis Date   Arthritis    fingers   Atypical mole 01/30/2005   slight atypia on abdomen   Atypical mole 10/21/2006   slight to moderate atypia on left calf   Atypical mole 02/15/2008   slight to moderate atypia on right anterior thigh   Atypical mole 11/02/2008   slight atypia on right buttock   Cataract    Hyperlipidemia    Squamous cell carcinoma of skin 05/03/2020   Atypical squamous proliferation-left thigh posterior- cx3, cautery 49f   Thyroid disease      Past Surgical History: Past Surgical History:  Procedure Laterality Date   COLONOSCOPY     HERNIA REPAIR       Family History: Family History  Problem Relation Age of Onset   Lung cancer Mother    Diabetes Father    Heart disease Father    Hypertension Brother    Colon cancer Neg Hx    Colon polyps Neg Hx    Esophageal cancer Neg Hx    Rectal cancer Neg Hx    Stomach cancer Neg Hx     Social History: Social History   Socioeconomic History   Marital status: Single    Spouse name: Not on file   Number of children: Not on file   Years of education: Not on file   Highest education level: Not on file  Occupational History   Not on file  Tobacco Use  Smoking status: Never   Smokeless tobacco: Never  Vaping Use   Vaping Use: Never used  Substance and Sexual Activity   Alcohol use: No   Drug use: Never   Sexual activity: Not on file  Other Topics Concern   Not on file  Social History Narrative   Not on file   Social Determinants of Health   Financial Resource Strain: Low Risk  (04/03/2022)   Overall Financial Resource Strain (CARDIA)    Difficulty of Paying Living Expenses: Not hard at all  Food Insecurity: No Food Insecurity (04/03/2022)   Hunger Vital Sign    Worried About Running Out of Food  in the Last Year: Never true    Ran Out of Food in the Last Year: Never true  Transportation Needs: No Transportation Needs (04/03/2022)   PRAPARE - Hydrologist (Medical): No    Lack of Transportation (Non-Medical): No  Physical Activity: Sufficiently Active (04/03/2022)   Exercise Vital Sign    Days of Exercise per Week: 5 days    Minutes of Exercise per Session: 30 min  Stress: No Stress Concern Present (04/03/2022)   [place]    Feeling of Stress : Not at all  Social Connections: Unknown (04/03/2022)   Social Connection and Isolation Panel [NHANES]    Frequency of Communication with Friends and Family: More than three times a week    Frequency of Social Gatherings with Friends and Family: More than three times a week    Attends Religious Services: Patient refused    Marine scientist or Organizations: Patient refused    Attends Music therapist: Patient refused    Marital Status: Never married    Allergies: No Known Allergies  Outpatient Meds: Current Outpatient Medications  Medication Sig Dispense Refill   SYNTHROID 50 MCG tablet TAKE 1 TABLET BY MOUTH DAILY EVERY MONDAY THRU FRIDAY AND 2 TABLETS DAILY ON SATURDAYS AND SUNDAYS 108 tablet 0   No current facility-administered medications for this visit.      ___________________________________________________________________ Objective   Exam:  BP (!) 144/90 (BP Location: Left Arm, Patient Position: Sitting, Cuff Size: Normal)   Pulse 63   Resp 14   Ht 5' 5.5" (1.664 m)   Wt 141 lb 9.6 oz (64.2 kg)   SpO2 98%   BMI 23.21 kg/m  Wt Readings from Last 3 Encounters:  05/28/22 141 lb 9.6 oz (64.2 kg)  05/16/22 140 lb (63.5 kg)  02/08/22 143 lb (64.9 kg)    General: Well-appearing Eyes: sclera anicteric, no redness ENT: oral mucosa moist without lesions, no cervical or supraclavicular lymphadenopathy CV:  Regular without murmur, no JVD, no peripheral edema Resp: clear to auscultation bilaterally, normal RR and effort noted GI: soft, no tenderness, with active bowel sounds. No guarding or palpable organomegaly noted. Skin; warm and dry, no rash or jaundice noted Neuro: awake, alert and oriented x 3. Normal gross motor function and fluent speech She showed me the location of the left flank pain, it is along the posterior medial chest wall no tenderness at present and no palpable abnormalities. Labs:     Latest Ref Rng & Units 05/16/2022   12:20 PM 02/08/2022    9:42 AM 01/29/2022   11:23 AM  CBC  WBC 4.0 - 10.5 K/uL 6.6  6.9  11.4   Hemoglobin 12.0 - 15.0 g/dL 13.9  13.6  13.5   Hematocrit 36.0 - 46.0 %  41.4  40.6  41.0   Platelets 150.0 - 400.0 K/uL 367.0  469.0  355.0      Radiologic Studies:   _______________________________________________   Narrative & Impression CLINICAL DATA:  Pain left lower quadrant   EXAM: CT ABDOMEN AND PELVIS WITH CONTRAST   TECHNIQUE: Multidetector CT imaging of the abdomen and pelvis was performed using the standard protocol following bolus administration of intravenous contrast.   RADIATION DOSE REDUCTION: This exam was performed according to the departmental dose-optimization program which includes automated exposure control, adjustment of the mA and/or kV according to patient size and/or use of iterative reconstruction technique.   CONTRAST:  166m ISOVUE-300 IOPAMIDOL (ISOVUE-300) INJECTION 61%   COMPARISON:  CT Tran on 02/14/2012 abdomen radiographs Tran on 02/08/2022   FINDINGS: Lower chest: Unremarkable.   Hepatobiliary: No focal abnormality is seen in the liver. There is no dilation of bile ducts. Gallbladder is not distended.   Pancreas: No focal abnormality is seen.   Spleen: Unremarkable.   Adrenals/Urinary Tract: Adrenals are not enlarged. There is no hydronephrosis. There are no renal or ureteral stones. Urinary bladder  is unremarkable.   Stomach/Bowel: Small hiatal hernia is seen. Small bowel loops are not dilated. Appendix is slightly prominent in caliber measuring 7 mm. There is small amount of fluid in the lumen of the appendix. There is no significant wall thickening. There is no pericolic stranding. There is no significant wall thickening in the colon. Scattered diverticula are seen in colon without signs of focal diverticulitis.   Vascular/Lymphatic: Unremarkable.   Reproductive: Unremarkable.   Other: There is no ascites or pneumoperitoneum. Small umbilical hernia containing fat is seen.   Musculoskeletal: Unremarkable.   IMPRESSION: There is no evidence of intestinal obstruction or pneumoperitoneum. There is no hydronephrosis.   Small hiatal hernia. Scattered diverticula are seen in the colon without signs of focal diverticulitis.   Other findings as described in the body of the report.     Electronically Signed   By: PElmer PickerM.D.   On: 02/20/2022 12:23   Assessment: Encounter Diagnoses  Name Primary?   Lower abdominal pain Yes   Left flank pain     Not withstanding that she reportedly improved after starting antibiotics, the reported symptoms and overall clinical picture are not typical for diverticulitis.  If she has another episode, particularly with flank pain as previously described, I would investigate possible musculoskeletal causes and obtain CT abdomen and pelvis if there is abdominal pain and clinical concern for diverticulitis before giving another round of antibiotics. Plan:  I do not feel she needs a colonoscopy or other testing at this point and I have reassured her.  She is welcome to see me again as needed.  Thank you for the courtesy of this consult.  Please call me with any questions or concerns.  HNelida MeuseIII  CC: Referring provider noted above

## 2022-08-05 ENCOUNTER — Encounter: Payer: Self-pay | Admitting: Internal Medicine

## 2022-08-05 ENCOUNTER — Ambulatory Visit (INDEPENDENT_AMBULATORY_CARE_PROVIDER_SITE_OTHER): Payer: Medicare HMO | Admitting: Internal Medicine

## 2022-08-05 VITALS — BP 130/84 | HR 69 | Temp 98.7°F | Ht 65.5 in | Wt 141.0 lb

## 2022-08-05 DIAGNOSIS — E782 Mixed hyperlipidemia: Secondary | ICD-10-CM | POA: Diagnosis not present

## 2022-08-05 DIAGNOSIS — E039 Hypothyroidism, unspecified: Secondary | ICD-10-CM

## 2022-08-05 DIAGNOSIS — Z0000 Encounter for general adult medical examination without abnormal findings: Secondary | ICD-10-CM

## 2022-08-05 DIAGNOSIS — Z1159 Encounter for screening for other viral diseases: Secondary | ICD-10-CM

## 2022-08-05 DIAGNOSIS — R7303 Prediabetes: Secondary | ICD-10-CM | POA: Diagnosis not present

## 2022-08-05 LAB — COMPREHENSIVE METABOLIC PANEL
ALT: 18 U/L (ref 0–35)
AST: 19 U/L (ref 0–37)
Albumin: 4 g/dL (ref 3.5–5.2)
Alkaline Phosphatase: 90 U/L (ref 39–117)
BUN: 9 mg/dL (ref 6–23)
CO2: 28 mEq/L (ref 19–32)
Calcium: 9.2 mg/dL (ref 8.4–10.5)
Chloride: 100 mEq/L (ref 96–112)
Creatinine, Ser: 0.85 mg/dL (ref 0.40–1.20)
GFR: 68.93 mL/min (ref >60.00)
Glucose, Bld: 103 mg/dL — ABNORMAL HIGH (ref 70–99)
Potassium: 4.1 mEq/L (ref 3.5–5.1)
Sodium: 135 mEq/L (ref 135–145)
Total Bilirubin: 0.7 mg/dL (ref 0.2–1.2)
Total Protein: 6.9 g/dL (ref 6.0–8.3)

## 2022-08-05 LAB — TSH: TSH: 4.03 u[IU]/mL (ref 0.35–5.50)

## 2022-08-05 LAB — HEMOGLOBIN A1C: Hgb A1c MFr Bld: 6.2 % (ref 4.6–6.5)

## 2022-08-05 LAB — CBC
HCT: 40 % (ref 36.0–46.0)
Hemoglobin: 13.4 g/dL (ref 12.0–15.0)
MCHC: 33.6 g/dL (ref 30.0–36.0)
MCV: 86.7 fl (ref 78.0–100.0)
Platelets: 346 10*3/uL (ref 150.0–400.0)
RBC: 4.61 Mil/uL (ref 3.87–5.11)
RDW: 13.2 % (ref 11.5–15.5)
WBC: 6.4 10*3/uL (ref 4.0–10.5)

## 2022-08-05 LAB — LIPID PANEL
Cholesterol: 222 mg/dL — ABNORMAL HIGH (ref 0–200)
HDL: 55.3 mg/dL (ref >39.00)
LDL Cholesterol: 140 mg/dL — ABNORMAL HIGH (ref 0–99)
NonHDL: 166.29
Total CHOL/HDL Ratio: 4
Triglycerides: 132 mg/dL (ref 0.0–149.0)
VLDL: 26.4 mg/dL (ref 0.0–40.0)

## 2022-08-05 MED ORDER — SYNTHROID 50 MCG PO TABS
ORAL_TABLET | ORAL | 3 refills | Status: DC
Start: 2022-08-05 — End: 2023-08-11

## 2022-08-05 NOTE — Assessment & Plan Note (Signed)
Checking lipid panel and adjust as needed. Not on medication currently.

## 2022-08-05 NOTE — Assessment & Plan Note (Signed)
Flu shot yearly. Covid-19 counseled. Pneumonia complete. Shingrix complete. Tetanus up to date. Colonoscopy up to date. Mammogram up to date, pap smear aged out and dexa complete. Counseled about sun safety and mole surveillance. Counseled about the dangers of distracted driving. Given 10 year screening recommendations.

## 2022-08-05 NOTE — Assessment & Plan Note (Signed)
Checking TSH and adjust synthroid 50 mcg daily M-F and 100 mcg daily sat/sun as needed.

## 2022-08-05 NOTE — Patient Instructions (Addendum)
We will check the labs today. 

## 2022-08-05 NOTE — Progress Notes (Signed)
   Subjective:   Patient ID: Kimberly Tran, female    DOB: [birth year], 71 y.o.   MRN: [id number]  HPI The patient is here for physical.  PMH, [facility], social history reviewed and updated  Review of Systems  Constitutional: Negative.   HENT: Negative.    Eyes: Negative.   Respiratory:  Negative for cough, chest tightness and shortness of breath.   Cardiovascular:  Negative for chest pain, palpitations and leg swelling.  Gastrointestinal:  Negative for abdominal distention, abdominal pain, constipation, diarrhea, nausea and vomiting.  Musculoskeletal: Negative.   Skin: Negative.   Neurological: Negative.   Psychiatric/Behavioral: Negative.      Objective:  Physical Exam Constitutional:      Appearance: She is well-developed.  HENT:     Head: Normocephalic and atraumatic.  Cardiovascular:     Rate and Rhythm: Normal rate and regular rhythm.  Pulmonary:     Effort: Pulmonary effort is normal. No respiratory distress.     Breath sounds: Normal breath sounds. No wheezing or rales.  Abdominal:     General: Bowel sounds are normal. There is no distension.     Palpations: Abdomen is soft.     Tenderness: There is no abdominal tenderness. There is no rebound.  Musculoskeletal:     Cervical back: Normal range of motion.  Skin:    General: Skin is warm and dry.  Neurological:     Mental Status: She is alert and oriented to person, place, and time.     Coordination: Coordination normal.     Vitals:   08/05/22 0900  BP: 130/84  Pulse: 69  Temp: 98.7 F (37.1 C)  TempSrc: Oral  SpO2: 99%  Weight: 141 lb (64 kg)  Height: 5' 5.5" (1.664 m)    Assessment & Plan:

## 2022-08-05 NOTE — Assessment & Plan Note (Signed)
Checking HgA1c for follow up, prior 6.0.

## 2022-08-06 LAB — HEPATITIS C ANTIBODY: Hepatitis C Ab: NONREACTIVE

## 2022-08-22 ENCOUNTER — Other Ambulatory Visit: Payer: Self-pay | Admitting: Obstetrics and Gynecology

## 2022-08-22 DIAGNOSIS — Z1231 Encounter for screening mammogram for malignant neoplasm of breast: Secondary | ICD-10-CM

## 2022-10-16 ENCOUNTER — Ambulatory Visit
Admission: RE | Admit: 2022-10-16 | Discharge: 2022-10-16 | Disposition: A | Discharge status: Home / Self Care | Payer: Medicare HMO | Source: Ambulatory Visit | Attending: Obstetrics and Gynecology | Admitting: Obstetrics and Gynecology

## 2022-10-16 DIAGNOSIS — Z1231 Encounter for screening mammogram for malignant neoplasm of breast: Secondary | ICD-10-CM

## 2023-02-10 ENCOUNTER — Encounter: Payer: Self-pay | Admitting: Internal Medicine

## 2023-02-10 ENCOUNTER — Ambulatory Visit (INDEPENDENT_AMBULATORY_CARE_PROVIDER_SITE_OTHER): Payer: Medicare HMO | Admitting: Internal Medicine

## 2023-02-10 VITALS — BP 138/100 | HR 72 | Temp 98.1°F | Ht 65.5 in | Wt 144.1 lb

## 2023-02-10 DIAGNOSIS — M545 Low back pain, unspecified: Secondary | ICD-10-CM

## 2023-02-10 DIAGNOSIS — M549 Dorsalgia, unspecified: Secondary | ICD-10-CM | POA: Insufficient documentation

## 2023-02-10 DIAGNOSIS — R7303 Prediabetes: Secondary | ICD-10-CM | POA: Diagnosis not present

## 2023-02-10 LAB — POCT GLYCOSYLATED HEMOGLOBIN (HGB A1C): HbA1c POC (<> result, manual entry): 5.8 % (ref 4.0–5.6)

## 2023-02-10 NOTE — Patient Instructions (Signed)
Take the aleve for the next week or two.

## 2023-02-10 NOTE — Assessment & Plan Note (Signed)
Suspect SI joint inflammation. Advised to take aleve regular for 1-2 weeks. No red flag signs and no indication for imaging today. Started 2-3 days ago.

## 2023-02-10 NOTE — Progress Notes (Signed)
   Subjective:   Patient ID: LOLITTA Tran, female    DOB: 12-10-1951, 72 y.o.   MRN: [id number]  HPI The patient is a 72 YO female coming in for follow up pre-diabetes with new back pain.  Review of Systems  Constitutional: Negative.   HENT: Negative.    Eyes: Negative.   Respiratory:  Negative for cough, chest tightness and shortness of breath.   Cardiovascular:  Negative for chest pain, palpitations and leg swelling.  Gastrointestinal:  Negative for abdominal distention, abdominal pain, constipation, diarrhea, nausea and vomiting.  Musculoskeletal:  Positive for back pain.  Skin: Negative.   Neurological: Negative.   Psychiatric/Behavioral: Negative.      Objective:  Physical Exam Constitutional:      Appearance: She is well-developed.  HENT:     Head: Normocephalic and atraumatic.  Cardiovascular:     Rate and Rhythm: Normal rate and regular rhythm.  Pulmonary:     Effort: Pulmonary effort is normal. No respiratory distress.     Breath sounds: Normal breath sounds. No wheezing or rales.  Abdominal:     General: Bowel sounds are normal. There is no distension.     Palpations: Abdomen is soft.     Tenderness: There is no abdominal tenderness. There is no rebound.  Musculoskeletal:        General: Tenderness present.     Cervical back: Normal range of motion.  Skin:    General: Skin is warm and dry.  Neurological:     Mental Status: She is alert and oriented to person, place, and time.     Coordination: Coordination normal.     Vitals:   02/10/23 0910  BP: (!) 140/100  Pulse: 72  Temp: 98.1 F (36.7 C)  TempSrc: Oral  SpO2: 98%  Weight: 144 lb 2 oz (65.4 kg)  Height: 5' 5.5" (1.664 m)    Assessment & Plan:

## 2023-02-10 NOTE — Assessment & Plan Note (Signed)
POC HgA1c done today and improved. Still pre-diabetes. Counseled extensively about diet and exercise to avoid diabetes.

## 2023-03-04 ENCOUNTER — Ambulatory Visit (INDEPENDENT_AMBULATORY_CARE_PROVIDER_SITE_OTHER): Payer: Medicare HMO | Admitting: Internal Medicine

## 2023-03-04 VITALS — BP 130/76 | HR 79 | Temp 97.7°F | Ht 65.5 in | Wt 143.0 lb

## 2023-03-04 DIAGNOSIS — R7303 Prediabetes: Secondary | ICD-10-CM

## 2023-03-04 DIAGNOSIS — R350 Frequency of micturition: Secondary | ICD-10-CM | POA: Diagnosis not present

## 2023-03-04 DIAGNOSIS — R1030 Lower abdominal pain, unspecified: Secondary | ICD-10-CM | POA: Insufficient documentation

## 2023-03-04 DIAGNOSIS — E039 Hypothyroidism, unspecified: Secondary | ICD-10-CM

## 2023-03-04 LAB — CBC WITH DIFFERENTIAL/PLATELET
Basophils Absolute: 0.1 10*3/uL (ref 0.0–0.1)
Basophils Relative: 1.3 % (ref 0.0–3.0)
Eosinophils Absolute: 0.2 10*3/uL (ref 0.0–0.7)
Eosinophils Relative: 3 % (ref 0.0–5.0)
HCT: 41.3 % (ref 36.0–46.0)
Hemoglobin: 14.3 g/dL (ref 12.0–15.0)
Lymphocytes Relative: 34.1 % (ref 12.0–46.0)
Lymphs Abs: 2.1 10*3/uL (ref 0.7–4.0)
MCHC: 34.6 g/dL (ref 30.0–36.0)
MCV: 86.8 fl (ref 78.0–100.0)
Monocytes Absolute: 0.6 10*3/uL (ref 0.1–1.0)
Monocytes Relative: 10.1 % (ref 3.0–12.0)
Neutro Abs: 3.2 10*3/uL (ref 1.4–7.7)
Neutrophils Relative %: 51.5 % (ref 43.0–77.0)
Platelets: 405 10*3/uL — ABNORMAL HIGH (ref 150.0–400.0)
RBC: 4.76 Mil/uL (ref 3.87–5.11)
RDW: 13.2 % (ref 11.5–15.5)
WBC: 6.3 10*3/uL (ref 4.0–10.5)

## 2023-03-04 LAB — HEPATIC FUNCTION PANEL
ALT: 20 U/L (ref 0–35)
AST: 21 U/L (ref 0–37)
Albumin: 4.1 g/dL (ref 3.5–5.2)
Alkaline Phosphatase: 105 U/L (ref 39–117)
Bilirubin, Direct: 0.1 mg/dL (ref 0.0–0.3)
Total Bilirubin: 0.7 mg/dL (ref 0.2–1.2)
Total Protein: 7.5 g/dL (ref 6.0–8.3)

## 2023-03-04 LAB — BASIC METABOLIC PANEL
BUN: 11 mg/dL (ref 6–23)
CO2: 27 mEq/L (ref 19–32)
Calcium: 9.4 mg/dL (ref 8.4–10.5)
Chloride: 101 mEq/L (ref 96–112)
Creatinine, Ser: 0.83 mg/dL (ref 0.40–1.20)
GFR: 70.65 mL/min (ref >60.00)
Glucose, Bld: 94 mg/dL (ref 70–99)
Potassium: 4.4 mEq/L (ref 3.5–5.1)
Sodium: 137 mEq/L (ref 135–145)

## 2023-03-04 LAB — URINALYSIS, ROUTINE W REFLEX MICROSCOPIC
Bilirubin Urine: NEGATIVE
Hgb urine dipstick: NEGATIVE
Ketones, ur: NEGATIVE
Leukocytes,Ua: NEGATIVE
Nitrite: NEGATIVE
RBC / HPF: NONE SEEN (ref 0–?)
Specific Gravity, Urine: <=1.005 — AB (ref 1.000–1.030)
Total Protein, Urine: NEGATIVE
Urine Glucose: NEGATIVE
Urobilinogen, UA: 0.2 (ref 0.0–1.0)
pH: 6.5 (ref 5.0–8.0)

## 2023-03-04 LAB — LIPASE: Lipase: 9 U/L — ABNORMAL LOW (ref 11.0–59.0)

## 2023-03-04 MED ORDER — CIPROFLOXACIN HCL 500 MG PO TABS: 500.0000 mg | ORAL_TABLET | Freq: Two times a day (BID) | ORAL | 0 refills | Status: AC

## 2023-03-04 MED ORDER — METRONIDAZOLE 250 MG PO TABS
250.0000 mg | ORAL_TABLET | Freq: Three times a day (TID) | ORAL | 0 refills | Status: DC
Start: 2023-03-04 — End: 2023-07-02

## 2023-03-04 NOTE — Progress Notes (Unsigned)
Patient ID: Kimberly Tran, female   DOB: 08/22/1951, 72 y.o.   MRN: [id number]        Chief Complaint: follow up lower abd pain, concern for acute diverticulitis       HPI:  Kimberly Tran is a 72 y.o. female here with h/o presumed diverticulitis 2020 (though CT did not show this 2023), now with 4 days onset similar symptoms lower abd pain, feverish, intermittent, severe at times and sharp, in the last day more localized to mid lower with possible urinary frequency.  Has some chills the night before last as well.  Denies urinary symptoms such as dysuria, urgency, flank pain, hematuria or n/v, fever, chills.Pt denies chest pain, increased sob or doe, wheezing, orthopnea, PND, increased LE swelling, palpitations, dizziness or syncope.   Pt denies fever, wt loss, night sweats, loss of appetite, or other constitutional symptoms   Pt denies polydipsia, polyuria, or new focal neuro s/s.   Denies hyper or hypo thyroid symptoms such as voice, skin or hair change.        Wt Readings from Last 3 Encounters:  03/04/23 143 lb (64.9 kg)  02/10/23 144 lb 2 oz (65.4 kg)  08/05/22 141 lb (64 kg)   BP Readings from Last 3 Encounters:  03/04/23 130/76  02/10/23 (!) 138/100  08/05/22 130/84         Past Medical History:  Diagnosis Date   Arthritis    fingers   Atypical mole 01/30/2005   slight atypia on abdomen   Atypical mole 10/21/2006   slight to moderate atypia on left calf   Atypical mole 02/15/2008   slight to moderate atypia on right anterior thigh   Atypical mole 11/02/2008   slight atypia on right buttock   Cataract    Hyperlipidemia    Squamous cell carcinoma of skin 05/03/2020   Atypical squamous proliferation-left thigh posterior- cx3, cautery 76fu   Thyroid disease    Past Surgical History:  Procedure Laterality Date   COLONOSCOPY     HERNIA REPAIR      reports that she has never smoked. She has never used smokeless tobacco. She reports that she does not drink alcohol and does  not use drugs. family history includes Diabetes in her father; Heart disease in her father; Hypertension in her brother; Lung cancer in her mother. No Known Allergies Current Outpatient Medications on File Prior to Visit  Medication Sig Dispense Refill   SYNTHROID 50 MCG tablet TAKE 1 TABLET BY MOUTH DAILY EVERY MONDAY THRU FRIDAY AND 2 TABLETS DAILY ON SATURDAYS AND SUNDAYS 108 tablet 3   fluorometholone (FML) 0.1 % ophthalmic suspension Place 1 drop into the right eye 2 (two) times daily. (Patient not taking: Reported on 03/04/2023)     No current facility-administered medications on file prior to visit.        ROS:  All others reviewed and negative.  Objective        PE:  BP 130/76   Pulse 79   Temp 97.7 F (36.5 C) (Oral)   Ht 5' 5.5" (1.664 m)   Wt 143 lb (64.9 kg)   SpO2 99%   BMI 23.43 kg/m                 Constitutional: Pt appears in NAD               HENT: Head: NCAT.                Right Ear:  External ear normal.                 Left Ear: External ear normal.                Eyes: . Pupils are equal, round, and reactive to light. Conjunctivae and EOM are normal               Nose: without d/c or deformity               Neck: Neck supple. Gross normal ROM               Cardiovascular: Normal rate and regular rhythm.                 Pulmonary/Chest: Effort normal and breath sounds without rales or wheezing.                Abd:  Soft,  ND, + BS, no organomegaly but hs low mid abd tender without guarding or rebound               Neurological: Pt is alert. At baseline orientation, motor grossly intact               Skin: Skin is warm. No rashes, no other new lesions, LE edema - none               Psychiatric: Pt behavior is normal without agitation   Micro: none  Cardiac tracings I have personally interpreted today:  none  Pertinent Radiological findings (summarize): none   Lab Results  Component Value Date   WBC 6.3 03/04/2023   HGB 14.3 03/04/2023   HCT 41.3  03/04/2023   PLT 405.0 (H) 03/04/2023   GLUCOSE 94 03/04/2023   CHOL 222 (H) 08/05/2022   TRIG 132.0 08/05/2022   HDL 55.30 08/05/2022   LDLCALC 140 (H) 08/05/2022   ALT 20 03/04/2023   AST 21 03/04/2023   NA 137 03/04/2023   K 4.4 03/04/2023   CL 101 03/04/2023   CREATININE 0.83 03/04/2023   BUN 11 03/04/2023   CO2 27 03/04/2023   TSH 4.03 08/05/2022   HGBA1C 5.8 02/10/2023   Assessment/Plan:  Kimberly Tran is a 72 y.o. White or Caucasian [1] female with  has a past medical history of Arthritis, Atypical mole (01/30/2005), Atypical mole (10/21/2006), Atypical mole (02/15/2008), Atypical mole (11/02/2008), Cataract, Hyperlipidemia, Squamous cell carcinoma of skin (05/03/2020), and Thyroid disease.  Abdominal pain, lower Etiology unclear, possibly uti vs diverticulitis vs other, for empiric cipro and flagyl, also ua and culture with cbc, labs - but consider CT if urine culture neg  Pre-diabetes Lab Results  Component Value Date   HGBA1C 5.8 02/10/2023   Stable, pt to continue current medical treatment   - diet, wt control   Hypothyroid Lab Results  Component Value Date   TSH 4.03 08/05/2022   Stable, pt to continue levothyroxine 50 mcg qd  Urinary frequency For urine culture r/o uti  Followup: Return if symptoms worsen or fail to improve.  Cathlean Cower, MD 03/05/2023 9:17 PM [facility]

## 2023-03-04 NOTE — Patient Instructions (Signed)
Please take all new medication as prescribed - the cipro and flagyl  Please continue all other medications as before, and refills have been done if requested.  Please have the pharmacy call with any other refills you may need.  Please keep your appointments with your specialists as you may have planned  If the urine culture is negative and especially if you do not improve, we should consider a repeat CT scan

## 2023-03-05 ENCOUNTER — Other Ambulatory Visit: Payer: Self-pay | Admitting: Internal Medicine

## 2023-03-05 ENCOUNTER — Encounter: Payer: Self-pay | Admitting: Internal Medicine

## 2023-03-05 DIAGNOSIS — R1030 Lower abdominal pain, unspecified: Secondary | ICD-10-CM

## 2023-03-05 DIAGNOSIS — R350 Frequency of micturition: Secondary | ICD-10-CM | POA: Insufficient documentation

## 2023-03-05 LAB — URINE CULTURE: Result:: NO GROWTH

## 2023-03-05 NOTE — Assessment & Plan Note (Addendum)
Lab Results  Component Value Date   TSH 4.03 08/05/2022   Stable, pt to continue levothyroxine 50 mcg qd

## 2023-03-05 NOTE — Assessment & Plan Note (Signed)
For urine culture r/o uti

## 2023-03-05 NOTE — Assessment & Plan Note (Signed)
Etiology unclear, possibly uti vs diverticulitis vs other, for empiric cipro and flagyl, also ua and culture with cbc, labs - but consider CT if urine culture neg

## 2023-03-05 NOTE — Assessment & Plan Note (Signed)
Lab Results  Component Value Date   HGBA1C 5.8 02/10/2023   Stable, pt to continue current medical treatment   - diet, wt control

## 2023-03-06 ENCOUNTER — Telehealth: Payer: Self-pay | Admitting: Internal Medicine

## 2023-03-06 NOTE — Telephone Encounter (Signed)
Patient returned Aubrey's call from her visit with Dr. Jenny Reichmann. She would like a call back at [phone].

## 2023-03-07 ENCOUNTER — Ambulatory Visit (HOSPITAL_COMMUNITY)
Admission: RE | Admit: 2023-03-07 | Discharge: 2023-03-07 | Disposition: A | Discharge status: Home / Self Care | Payer: Medicare HMO | Source: Ambulatory Visit | Attending: Internal Medicine | Admitting: Internal Medicine

## 2023-03-07 DIAGNOSIS — R1030 Lower abdominal pain, unspecified: Secondary | ICD-10-CM | POA: Insufficient documentation

## 2023-03-07 MED ORDER — IOHEXOL 9 MG/ML PO SOLN
ORAL | Status: AC
  Filled 2023-03-07: qty 1000

## 2023-03-07 MED ORDER — IOHEXOL 9 MG/ML PO SOLN
500.0000 mL | ORAL | Status: AC
  Administered 2023-03-07 (×2): 500 mL via ORAL

## 2023-03-07 MED ORDER — IOHEXOL 300 MG/ML  SOLN
100.0000 mL | Freq: Once | INTRAMUSCULAR | Status: AC | PRN
  Administered 2023-03-07: 100 mL via INTRAVENOUS

## 2023-04-02 ENCOUNTER — Encounter: Payer: Self-pay | Admitting: Dermatology

## 2023-04-02 ENCOUNTER — Ambulatory Visit: Payer: Medicare HMO | Admitting: Dermatology

## 2023-04-02 VITALS — BP 131/83

## 2023-04-02 DIAGNOSIS — W908XXA Exposure to other nonionizing radiation, initial encounter: Secondary | ICD-10-CM | POA: Diagnosis not present

## 2023-04-02 DIAGNOSIS — L578 Other skin changes due to chronic exposure to nonionizing radiation: Secondary | ICD-10-CM | POA: Diagnosis not present

## 2023-04-02 DIAGNOSIS — Z1283 Encounter for screening for malignant neoplasm of skin: Secondary | ICD-10-CM | POA: Diagnosis not present

## 2023-04-02 DIAGNOSIS — L821 Other seborrheic keratosis: Secondary | ICD-10-CM

## 2023-04-02 DIAGNOSIS — L820 Inflamed seborrheic keratosis: Secondary | ICD-10-CM | POA: Diagnosis not present

## 2023-04-02 DIAGNOSIS — L814 Other melanin hyperpigmentation: Secondary | ICD-10-CM

## 2023-04-02 DIAGNOSIS — L570 Actinic keratosis: Secondary | ICD-10-CM | POA: Diagnosis not present

## 2023-04-02 DIAGNOSIS — D1801 Hemangioma of skin and subcutaneous tissue: Secondary | ICD-10-CM

## 2023-04-02 DIAGNOSIS — X32XXXA Exposure to sunlight, initial encounter: Secondary | ICD-10-CM

## 2023-04-02 DIAGNOSIS — D225 Melanocytic nevi of trunk: Secondary | ICD-10-CM

## 2023-04-02 NOTE — Patient Instructions (Addendum)
Cryotherapy Aftercare  Wash gently with soap and water everyday.   Apply Vaseline and Band-Aid daily until healed.   Due to recent changes in healthcare laws, you may see results of your pathology and/or laboratory studies on MyChart before the doctors have had a chance to review them. We understand that in some cases there may be results that are confusing or concerning to you. Please understand that not all results are received at the same time and often the doctors may need to interpret multiple results in order to provide you with the best plan of care or course of treatment. Therefore, we ask that you please give us 2 business days to thoroughly review all your results before contacting the office for clarification. Should we see a critical lab result, you will be contacted sooner.   If You Need Anything After Your Visit  If you have any questions or concerns for your doctor, please call our main line at [phone] If no one answers, please leave a voicemail as directed and we will return your call as soon as possible. Messages left after 4 pm will be answered the following business day.   You may also send us a message via MyChart. We typically respond to MyChart messages within 1-2 business days.  For prescription refills, please ask your pharmacy to contact our office. Our fax number is [phone].  If you have an urgent issue when the clinic is closed that cannot wait until the next business day, you can page your doctor at the number below.    Please note that while we do our best to be available for urgent issues outside of office hours, we are not available 24/7.   If you have an urgent issue and are unable to reach us, you may choose to seek medical care at your doctor's office, retail clinic, urgent care center, or emergency room.  If you have a medical emergency, please immediately call 911 or go to the emergency department. In the event of inclement weather, please call our  main line at [phone] for an update on the status of any delays or closures.  Dermatology Medication Tips: Please keep the boxes that topical medications come in in order to help keep track of the instructions about where and how to use these. Pharmacies typically print the medication instructions only on the boxes and not directly on the medication tubes.   If your medication is too expensive, please contact our office at [phone] or send us a message through MyChart.   We are unable to tell what your co-pay for medications will be in advance as this is different depending on your insurance coverage. However, we may be able to find a substitute medication at lower cost or fill out paperwork to get insurance to cover a needed medication.   If a prior authorization is required to get your medication covered by your insurance company, please allow us 1-2 business days to complete this process.  Drug prices often vary depending on where the prescription is filled and some pharmacies may offer cheaper prices.  The website www.goodrx.com contains coupons for medications through different pharmacies. The prices here do not account for what the cost may be with help from insurance (it may be cheaper with your insurance), but the website can give you the price if you did not use any insurance.  - You can print the associated coupon and take it with your prescription to the pharmacy.  - You may also   stop by our office during regular business hours and pick up a GoodRx coupon card.  - If you need your prescription sent electronically to a different pharmacy, notify our office through [facility] MyChart or by phone at [phone]     

## 2023-04-02 NOTE — Progress Notes (Signed)
   New Patient Visit   Subjective  Kimberly Tran is a 72 y.o. female who presents for the following: Skin Cancer Screening and Full Body Skin Exam  Patient has not had a FBSE in a couple years. Previous patient of Dr. Jorja Loa. She complains of Keratosis she wants them removed. No hx of skin cancer or abnormal moles. No family hx skin cancer.   The patient presents for Total-Body Skin Exam (TBSE) for skin cancer screening and mole check. The patient has spots, moles and lesions to be evaluated, some may be new or changing and the patient has concerns that these could be cancer.    The following portions of the chart were reviewed this encounter and updated as appropriate: medications, allergies, medical history  Review of Systems:  No other skin or systemic complaints except as noted in HPI or Assessment and Plan.  Objective  Well appearing patient in no apparent distress; mood and affect are within normal limits.  A full examination was performed including scalp, head, eyes, ears, nose, lips, neck, chest, axillae, abdomen, back, buttocks, bilateral upper extremities, bilateral lower extremities, hands, feet, fingers, toes, fingernails, and toenails. All findings within normal limits unless otherwise noted below.   Relevant physical exam findings are noted in the Assessment and Plan.  Chest - Medial [facility]), Head - Anterior (Face) Erythematous thin papules/macules with gritty scale.     Assessment & Plan   LENTIGINES, SEBORRHEIC KERATOSES, HEMANGIOMAS - Benign normal skin lesions - Benign-appearing - Call for any changes  MELANOCYTIC NEVI - Tan-brown and/or pink-flesh-colored symmetric macules and papules - Benign appearing on exam today - Observation - Call clinic for new or changing moles - Recommend daily use of broad spectrum spf 30+ sunscreen to sun-exposed areas.   ACTINIC DAMAGE - Chronic condition, secondary to cumulative UV/sun exposure - diffuse scaly  erythematous macules with underlying dyspigmentation - Recommend daily broad spectrum sunscreen SPF 30+ to sun-exposed areas, reapply every 2 hours as needed.  - Staying in the shade or wearing long sleeves, sun glasses (UVA+UVB protection) and wide brim hats (4-inch brim around the entire circumference of the hat) are also recommended for sun protection.  - Call for new or changing lesions.  SKIN CANCER SCREENING PERFORMED TODAY.   INFLAMED SEBORRHEIC KERATOSIS Exam: Erythematous keratotic or waxy stuck-on papule or plaque.  Symptomatic, irritating, patient would like treated.  Benign-appearing.  Call clinic for new or changing lesions.   Prior to procedure, discussed risks of blister formation, small wound, skin dyspigmentation, or rare scar following treatment. Recommend Vaseline ointment to treated areas while healing.  Destruction Procedure Note Destruction method: cryotherapy   Informed consent: discussed and consent obtained   Lesion destroyed using liquid nitrogen: Yes   Outcome: patient tolerated procedure well with no complications   Post-procedure details: wound care instructions given   Locations: Scalp, abdomen # of Lesions Treated: 5   Return in about 1 year (around 04/01/2024) for FBSE.    Documentation: I have reviewed the above documentation for accuracy and completeness, and I agree with the above.  Langston Reusing, MD  I, Germaine Pomfret, CMA, am acting as scribe for Langston Reusing, MD.

## 2023-04-10 ENCOUNTER — Telehealth: Payer: Self-pay | Admitting: Internal Medicine

## 2023-04-10 NOTE — Telephone Encounter (Signed)
Contacted TIFFIANY BEADLES to schedule their annual wellness visit. Appointment made for 04/23/2023.  [facility] Care Guide [facility] AWV TEAM Direct Dial: [phone]

## 2023-04-23 ENCOUNTER — Ambulatory Visit (INDEPENDENT_AMBULATORY_CARE_PROVIDER_SITE_OTHER): Payer: Medicare HMO

## 2023-04-23 VITALS — Ht 65.51 in | Wt 143.0 lb

## 2023-04-23 DIAGNOSIS — Z0000 Encounter for general adult medical examination without abnormal findings: Secondary | ICD-10-CM | POA: Diagnosis not present

## 2023-04-23 NOTE — Progress Notes (Signed)
I connected with  Kimberly Tran on 04/23/23 by a audio enabled telemedicine application and verified that I am speaking with the correct person using two identifiers.  Patient Location: Home  Provider Location: Office/Clinic  I discussed the limitations of evaluation and management by telemedicine. The patient expressed understanding and agreed to proceed.  Subjective:   Kimberly Tran is a 72 y.o. female who presents for Medicare Annual (Subsequent) preventive examination.  Review of Systems     Cardiac Risk Factors include: advanced age (>75men, >25 women);Other (see comment);dyslipidemia;family history of premature cardiovascular disease, Risk factor comments: Pre-diabetes     Objective:    Today's Vitals   04/23/23 1403  Weight: 143 lb (64.9 kg)  Height: 5' 5.51" (1.664 m)  PainSc: 0-No pain   Body mass index is 23.43 kg/m.     04/23/2023    2:05 PM 04/03/2022    2:03 PM  Advanced Directives  Does Patient Have a Medical Advance Directive? Yes Yes  Type of Estate agent of [place];Living will Living will;Healthcare Power of Attorney  Does patient want to make changes to medical advance directive?  No - Patient declined  Copy of Healthcare Power of Attorney in Chart? No - copy requested No - copy requested    Current Medications (verified) Outpatient Encounter Medications as of 04/23/2023  Medication Sig   SYNTHROID 50 MCG tablet TAKE 1 TABLET BY MOUTH DAILY EVERY MONDAY THRU FRIDAY AND 2 TABLETS DAILY ON SATURDAYS AND SUNDAYS   fluorometholone (FML) 0.1 % ophthalmic suspension Place 1 drop into the right eye 2 (two) times daily. (Patient not taking: Reported on 03/04/2023)   metroNIDAZOLE (FLAGYL) 250 MG tablet Take 1 tablet (250 mg total) by mouth 3 (three) times daily. (Patient not taking: Reported on 04/02/2023)   No facility-administered encounter medications on file as of 04/23/2023.    Allergies (verified) Patient has no known allergies.    History: Past Medical History:  Diagnosis Date   Arthritis    fingers   Atypical mole 01/30/2005   slight atypia on abdomen   Atypical mole 10/21/2006   slight to moderate atypia on left calf   Atypical mole 02/15/2008   slight to moderate atypia on right anterior thigh   Atypical mole 11/02/2008   slight atypia on right buttock   Cataract    Hyperlipidemia    Squamous cell carcinoma of skin 05/03/2020   Atypical squamous proliferation-left thigh posterior- cx3, cautery 81fu   Thyroid disease    Past Surgical History:  Procedure Laterality Date   COLONOSCOPY     HERNIA REPAIR     Family History  Problem Relation Age of Onset   Lung cancer Mother    Diabetes Father    Heart disease Father    Hypertension Brother    Colon cancer Neg Hx    Colon polyps Neg Hx    Esophageal cancer Neg Hx    Rectal cancer Neg Hx    Stomach cancer Neg Hx    Social History   Socioeconomic History   Marital status: Single    Spouse name: Not on file   Number of children: Not on file   Years of education: Not on file   Highest education level: Not on file  Occupational History   Not on file  Tobacco Use   Smoking status: Never   Smokeless tobacco: Never  Vaping Use   Vaping Use: Never used  Substance and Sexual Activity   Alcohol use: No  Drug use: Never   Sexual activity: Not on file  Other Topics Concern   Not on file  Social History Narrative   Not on file   Social Determinants of Health   Financial Resource Strain: Low Risk  (04/23/2023)   Overall Financial Resource Strain (CARDIA)    Difficulty of Paying Living Expenses: Not hard at all  Food Insecurity: No Food Insecurity (04/23/2023)   Hunger Vital Sign    Worried About Running Out of Food in the Last Year: Never true    Ran Out of Food in the Last Year: Never true  Transportation Needs: No Transportation Needs (04/23/2023)   PRAPARE - Administrator, Civil Service (Medical): No    Lack of Transportation  (Non-Medical): No  Physical Activity: Sufficiently Active (04/23/2023)   Exercise Vital Sign    Days of Exercise per Week: 5 days    Minutes of Exercise per Session: 30 min  Stress: No Stress Concern Present (04/23/2023)   [employer] of Occupational Health - Occupational Stress Questionnaire    Feeling of Stress : Not at all  Social Connections: Unknown (04/23/2023)   Social Connection and Isolation Panel [NHANES]    Frequency of Communication with Friends and Family: More than three times a week    Frequency of Social Gatherings with Friends and Family: More than three times a week    Attends Religious Services: Patient declined    Database administrator or Organizations: Patient declined    Attends Engineer, structural: Patient declined    Marital Status: Never married    Tobacco Counseling Counseling given: Not Answered   Clinical Intake:  Pre-visit preparation completed: Yes  Pain : No/denies pain Pain Score: 0-No pain     BMI - recorded: 23.43 Nutritional Status: BMI of 19-24  Normal Nutritional Risks: None Diabetes: No  How often do you need to have someone help you when you read instructions, pamphlets, or other written materials from your doctor or pharmacy?: 1 - Never What is the last grade level you completed in school?: HSG  Diabetic? No (patient is pre-diabetic)  Interpreter Needed?: No  Information entered by :: Susie Cassette, LPN.   Activities of Daily Living    04/23/2023    2:07 PM  In your present state of health, do you have any difficulty performing the following activities:  Hearing? 0  Vision? 0  Difficulty concentrating or making decisions? 0  Walking or climbing stairs? 0  Dressing or bathing? 0  Doing errands, shopping? 0  Preparing Food and eating ? N  Using the Toilet? N  In the past six months, have you accidently leaked urine? N  Do you have problems with loss of bowel control? N  Managing your Medications? N   Managing your Finances? N  Housekeeping or managing your Housekeeping? N    Patient Care Team: Myrlene Broker, MD as PCP - General (Internal Medicine) Janalyn Harder, MD (Inactive) as Consulting Physician (Dermatology) Maris Berger, MD as Consulting Physician (Ophthalmology)  Indicate any recent Medical Services you may have received from other than [facility] providers in the past year (date may be approximate).     Assessment:   This is a routine wellness examination for Kimberly Tran.  Hearing/Vision screen Hearing Screening - Comments:: Denies hearing difficulties   Vision Screening - Comments:: Wear readers - up to date with routine eye exams with Maris Berger, MD.   Dietary issues and exercise activities discussed: Current Exercise Habits: Home  exercise routine, Type of exercise: walking;Other - see comments (yard work, dancing, still working), Time (Minutes): 30, Frequency (Times/Week): 5, Weekly Exercise (Minutes/Week): 150, Intensity: Moderate, Exercise limited by: None identified   Goals Addressed             This Visit's Progress    My goal for 2024 is to lose 8 pounds.        Depression Screen    04/23/2023    2:06 PM 03/04/2023   10:49 AM 08/05/2022    9:14 AM 05/16/2022   11:55 AM 04/03/2022    2:09 PM 02/08/2022    9:02 AM 01/29/2022   10:12 AM  PHQ 2/9 Scores  PHQ - 2 Score 0 0 0 0 0 0 0  PHQ- 9 Score 0 0         Fall Risk    04/23/2023    2:06 PM 03/04/2023   10:49 AM 02/10/2023    9:13 AM 08/05/2022    9:14 AM 05/16/2022   11:56 AM  Fall Risk   Falls in the past year? 0 0 0 0 0  Number falls in past yr: 0 0 0 0 0  Injury with Fall? 0 0 0 0 0  Risk for fall due to : No Fall Risks No Fall Risks  No Fall Risks No Fall Risks  Follow up Falls prevention discussed Falls evaluation completed;Education provided Falls evaluation completed Falls evaluation completed Falls evaluation completed    FALL RISK PREVENTION PERTAINING TO THE HOME:  Any stairs  in or around the home? No  If so, are there any without handrails? No  Home free of loose throw rugs in walkways, pet beds, electrical cords, etc? Yes  Adequate lighting in your home to reduce risk of falls? Yes   ASSISTIVE DEVICES UTILIZED TO PREVENT FALLS:  Life alert? No  Use of a cane, walker or w/c? No  Grab bars in the bathroom? Yes  Shower chair or bench in shower? No  Elevated toilet seat or a handicapped toilet? No   TIMED UP AND GO:  Was the test performed? No . Telephonic Visit   Cognitive Function:        04/23/2023    2:06 PM 04/03/2022    2:13 PM  6CIT Screen  What Year? 0 points 0 points  What month? 0 points 0 points  What time? 0 points 0 points  Count back from 20 0 points 0 points  Months in reverse 0 points 0 points  Repeat phrase 0 points 0 points  Total Score 0 points 0 points    Immunizations Immunization History  Administered Date(s) Administered   Pneumococcal Conjugate-13 11/24/2019   Pneumococcal Polysaccharide-23 07/30/2021   Tdap 01/16/2021    TDAP status: Up to date  Flu Vaccine status: Declined, Education has been provided regarding the importance of this vaccine but patient still declined. Advised may receive this vaccine at local pharmacy or Health Dept. Aware to provide a copy of the vaccination record if obtained from local pharmacy or Health Dept. Verbalized acceptance and understanding.  Pneumococcal vaccine status: Up to date  Covid-19 vaccine status: Declined, Education has been provided regarding the importance of this vaccine but patient still declined. Advised may receive this vaccine at local pharmacy or Health Dept.or vaccine clinic. Aware to provide a copy of the vaccination record if obtained from local pharmacy or Health Dept. Verbalized acceptance and understanding.  Qualifies for Shingles Vaccine? Yes   Zostavax completed No  Shingrix Completed?: No.    Education has been provided regarding the importance of this  vaccine. Patient has been advised to call insurance company to determine out of pocket expense if they have not yet received this vaccine. Advised may also receive vaccine at local pharmacy or Health Dept. Verbalized acceptance and understanding.  Screening Tests Health Maintenance  Topic Date Due   INFLUENZA VACCINE  07/17/2023   Medicare Annual Wellness (AWV)  04/22/2024   MAMMOGRAM  10/16/2024   COLONOSCOPY (Pts 45-55yrs Insurance coverage will need to be confirmed)  07/21/2028   DTaP/Tdap/Td (2 - Td or Tdap) 01/16/2031   Pneumonia Vaccine 17+ Years old  Completed   DEXA SCAN  Completed   Hepatitis C Screening  Completed   HPV VACCINES  Aged Out   COVID-19 Vaccine  Discontinued   Zoster Vaccines- Shingrix  Discontinued    Health Maintenance  There are no preventive care reminders to display for this patient.   Colorectal cancer screening: Type of screening: Colonoscopy. Completed 07/21/2018. Repeat every 10 years  Mammogram status: Completed 10/16/2022. Repeat every year  Bone Density status: Completed 05/30/2018; no results in chart.  Lung Cancer Screening: (Low Dose CT Chest recommended if Age 74-80 years, 30 pack-year currently smoking OR have quit w/in 15years.) does not qualify.   Lung Cancer Screening Referral: no  Additional Screening:  Hepatitis C Screening: does qualify; Completed 08/05/2022  Vision Screening: Recommended annual ophthalmology exams for early detection of glaucoma and other disorders of the eye. Is the patient up to date with their annual eye exam?  Yes  Who is the provider or what is the name of the office in which the patient attends annual eye exams? Maris Berger, MD. If pt is not established with a provider, would they like to be referred to a provider to establish care? No .   Dental Screening: Recommended annual dental exams for proper oral hygiene  Community Resource Referral / Chronic Care Management: CRR required this visit?  No   CCM  required this visit?  Yes      Plan:     I have personally reviewed and noted the following in the patient's chart:   Medical and social history Use of alcohol, tobacco or illicit drugs  Current medications and supplements including opioid prescriptions. Patient is not currently taking opioid prescriptions. Functional ability and status Nutritional status Physical activity Advanced directives List of other physicians Hospitalizations, surgeries, and ER visits in previous 12 months Vitals Screenings to include cognitive, depression, and falls Referrals and appointments  In addition, I have reviewed and discussed with patient certain preventive protocols, quality metrics, and best practice recommendations. A written personalized care plan for preventive services as well as general preventive health recommendations were provided to patient.     Mickeal Needy, LPN   03/20/4097   Nurse Notes:  Normal cognitive status assessed by direct observation via telephone conversation by this Nurse Health Advisor. No abnormalities found.

## 2023-04-23 NOTE — Patient Instructions (Signed)
Kimberly Tran , Thank you for taking time to come for your Medicare Wellness Visit. I appreciate your ongoing commitment to your health goals. Please review the following plan we discussed and let me know if I can assist you in the future.   These are the goals we discussed:  Goals      My goal for 2024 is to lose 8 pounds.        This is a list of the screening recommended for you and due dates:  Health Maintenance  Topic Date Due   Flu Shot  07/17/2023   Medicare Annual Wellness Visit  04/22/2024   Mammogram  10/16/2024   Colon Cancer Screening  07/21/2028   DTaP/Tdap/Td vaccine (2 - Td or Tdap) 01/16/2031   Pneumonia Vaccine  Completed   DEXA scan (bone density measurement)  Completed   Hepatitis C Screening: USPSTF Recommendation to screen - Ages 60-79 yo.  Completed   HPV Vaccine  Aged Out   COVID-19 Vaccine  Discontinued   Zoster (Shingles) Vaccine  Discontinued    Advanced directives: yes  Conditions/risks identified: yes  Next appointment: Follow up in one year for your annual wellness visit.   Preventive Care 82 Years and Older, Female Preventive care refers to lifestyle choices and visits with your health care provider that can promote health and wellness. What does preventive care include? A yearly physical exam. This is also called an annual well check. Dental exams once or twice a year. Routine eye exams. Ask your health care provider how often you should have your eyes checked. Personal lifestyle choices, including: Daily care of your teeth and gums. Regular physical activity. Eating a healthy diet. Avoiding tobacco and drug use. Limiting alcohol use. Practicing safe sex. Taking low-dose aspirin every day. Taking vitamin and mineral supplements as recommended by your health care provider. What happens during an annual well check? The services and screenings done by your health care provider during your annual well check will depend on your age, overall  health, lifestyle risk factors, and family history of disease. Counseling  Your health care provider may ask you questions about your: Alcohol use. Tobacco use. Drug use. Emotional well-being. Home and relationship well-being. Sexual activity. Eating habits. History of falls. Memory and ability to understand (cognition). Work and work Astronomer. Reproductive health. Screening  You may have the following tests or measurements: Height, weight, and BMI. Blood pressure. Lipid and cholesterol levels. These may be checked every 5 years, or more frequently if you are over 62 years old. Skin check. Lung cancer screening. You may have this screening every year starting at age 75 if you have a 30-pack-year history of smoking and currently smoke or have quit within the past 15 years. Fecal occult blood test (FOBT) of the stool. You may have this test every year starting at age 56. Flexible sigmoidoscopy or colonoscopy. You may have a sigmoidoscopy every 5 years or a colonoscopy every 10 years starting at age 46. Hepatitis C blood test. Hepatitis B blood test. Sexually transmitted disease (STD) testing. Diabetes screening. This is done by checking your blood sugar (glucose) after you have not eaten for a while (fasting). You may have this done every 1-3 years. Bone density scan. This is done to screen for osteoporosis. You may have this done starting at age 42. Mammogram. This may be done every 1-2 years. Talk to your health care provider about how often you should have regular mammograms. Talk with your health care provider about  your test results, treatment options, and if necessary, the need for more tests. Vaccines  Your health care provider may recommend certain vaccines, such as: Influenza vaccine. This is recommended every year. Tetanus, diphtheria, and acellular pertussis (Tdap, Td) vaccine. You may need a Td booster every 10 years. Zoster vaccine. You may need this after age  31. Pneumococcal 13-valent conjugate (PCV13) vaccine. One dose is recommended after age 45. Pneumococcal polysaccharide (PPSV23) vaccine. One dose is recommended after age 35. Talk to your health care provider about which screenings and vaccines you need and how often you need them. This information is not intended to replace advice given to you by your health care provider. Make sure you discuss any questions you have with your health care provider. Document Released: 12/29/2015 Document Revised: 08/21/2016 Document Reviewed: 10/03/2015 Elsevier Interactive Patient Education  2017 [place] Prevention in the Home Falls can cause injuries. They can happen to people of all ages. There are many things you can do to make your home safe and to help prevent falls. What can I do on the outside of my home? Regularly fix the edges of walkways and driveways and fix any cracks. Remove anything that might make you trip as you walk through a door, such as a raised step or threshold. Trim any bushes or trees on the path to your home. Use bright outdoor lighting. Clear any walking paths of anything that might make someone trip, such as rocks or tools. Regularly check to see if handrails are loose or broken. Make sure that both sides of any steps have handrails. Any raised decks and porches should have guardrails on the edges. Have any leaves, snow, or ice cleared regularly. Use sand or salt on walking paths during winter. Clean up any spills in your garage right away. This includes oil or grease spills. What can I do in the bathroom? Use night lights. Install grab bars by the toilet and in the tub and shower. Do not use towel bars as grab bars. Use non-skid mats or decals in the tub or shower. If you need to sit down in the shower, use a plastic, non-slip stool. Keep the floor dry. Clean up any water that spills on the floor as soon as it happens. Remove soap buildup in the tub or shower  regularly. Attach bath mats securely with double-sided non-slip rug tape. Do not have throw rugs and other things on the floor that can make you trip. What can I do in the bedroom? Use night lights. Make sure that you have a light by your bed that is easy to reach. Do not use any sheets or blankets that are too big for your bed. They should not hang down onto the floor. Have a firm chair that has side arms. You can use this for support while you get dressed. Do not have throw rugs and other things on the floor that can make you trip. What can I do in the kitchen? Clean up any spills right away. Avoid walking on wet floors. Keep items that you use a lot in easy-to-reach places. If you need to reach something above you, use a strong step stool that has a grab bar. Keep electrical cords out of the way. Do not use floor polish or wax that makes floors slippery. If you must use wax, use non-skid floor wax. Do not have throw rugs and other things on the floor that can make you trip. What can I do with  my stairs? Do not leave any items on the stairs. Make sure that there are handrails on both sides of the stairs and use them. Fix handrails that are broken or loose. Make sure that handrails are as long as the stairways. Check any carpeting to make sure that it is firmly attached to the stairs. Fix any carpet that is loose or worn. Avoid having throw rugs at the top or bottom of the stairs. If you do have throw rugs, attach them to the floor with carpet tape. Make sure that you have a light switch at the top of the stairs and the bottom of the stairs. If you do not have them, ask someone to add them for you. What else can I do to help prevent falls? Wear shoes that: Do not have high heels. Have rubber bottoms. Are comfortable and fit you well. Are closed at the toe. Do not wear sandals. If you use a stepladder: Make sure that it is fully opened. Do not climb a closed stepladder. Make sure that  both sides of the stepladder are locked into place. Ask someone to hold it for you, if possible. Clearly mark and make sure that you can see: Any grab bars or handrails. First and last steps. Where the edge of each step is. Use tools that help you move around (mobility aids) if they are needed. These include: Canes. Walkers. Scooters. Crutches. Turn on the lights when you go into a dark area. Replace any light bulbs as soon as they burn out. Set up your furniture so you have a clear path. Avoid moving your furniture around. If any of your floors are uneven, fix them. If there are any pets around you, be aware of where they are. Review your medicines with your doctor. Some medicines can make you feel dizzy. This can increase your chance of falling. Ask your doctor what other things that you can do to help prevent falls. This information is not intended to replace advice given to you by your health care provider. Make sure you discuss any questions you have with your health care provider. Document Released: 09/28/2009 Document Revised: 05/09/2016 Document Reviewed: 01/06/2015 Elsevier Interactive Patient Education  2017 [employer].

## 2023-07-02 ENCOUNTER — Ambulatory Visit (INDEPENDENT_AMBULATORY_CARE_PROVIDER_SITE_OTHER): Payer: Medicare HMO | Admitting: Internal Medicine

## 2023-07-02 VITALS — BP 128/82 | HR 77 | Temp 98.5°F | Ht 65.0 in | Wt 144.0 lb

## 2023-07-02 DIAGNOSIS — K5792 Diverticulitis of intestine, part unspecified, without perforation or abscess without bleeding: Secondary | ICD-10-CM | POA: Diagnosis not present

## 2023-07-02 DIAGNOSIS — E039 Hypothyroidism, unspecified: Secondary | ICD-10-CM | POA: Diagnosis not present

## 2023-07-02 DIAGNOSIS — R7303 Prediabetes: Secondary | ICD-10-CM | POA: Diagnosis not present

## 2023-07-02 MED ORDER — METRONIDAZOLE 500 MG PO TABS: 500.0000 mg | ORAL_TABLET | Freq: Three times a day (TID) | ORAL | 0 refills | Status: AC

## 2023-07-02 MED ORDER — CIPROFLOXACIN HCL 500 MG PO TABS: 500.0000 mg | ORAL_TABLET | Freq: Two times a day (BID) | ORAL | 0 refills | Status: AC

## 2023-07-02 NOTE — Progress Notes (Signed)
Patient ID: Kimberly Tran, female   DOB: Dec 10, 1951, 72 y.o.   MRN: [id number]        Chief Complaint: follow up recurrent acute diverticulitis, low thyroid, preDM       HPI:  Kimberly Tran is a 72 y.o. female here with c/o recent repeat episode left sided pain c/w her prior episodes now 5 or 6 in the past 3 yrs, was seen at [facility] and pt concerned was only given 7 days antibx, usually has 10 and condition keeps recurring  Now on day #3 antibix and doing well.  Denies worsening reflux, abd pain, dysphagia, n/v, bowel change or blood.  Denies hyper or hypo thyroid symptoms such as voice, skin or hair change.  Pt denies chest pain, increased sob or doe, wheezing, orthopnea, PND, increased LE swelling, palpitations, dizziness or syncope.   Pt denies fever, wt loss, night sweats, loss of appetite, or other constitutional symptoms         Wt Readings from Last 3 Encounters:  07/02/23 144 lb (65.3 kg)  04/23/23 143 lb (64.9 kg)  03/04/23 143 lb (64.9 kg)   BP Readings from Last 3 Encounters:  07/02/23 128/82  04/02/23 131/83  03/04/23 130/76         Past Medical History:  Diagnosis Date   Arthritis    fingers   Atypical mole 01/30/2005   slight atypia on abdomen   Atypical mole 10/21/2006   slight to moderate atypia on left calf   Atypical mole 02/15/2008   slight to moderate atypia on right anterior thigh   Atypical mole 11/02/2008   slight atypia on right buttock   Cataract    Hyperlipidemia    Squamous cell carcinoma of skin 05/03/2020   Atypical squamous proliferation-left thigh posterior- cx3, cautery 51fu   Thyroid disease    Past Surgical History:  Procedure Laterality Date   COLONOSCOPY     HERNIA REPAIR      reports that she has never smoked. She has never used smokeless tobacco. She reports that she does not drink alcohol and does not use drugs. family history includes Diabetes in her father; Heart disease in her father; Hypertension in her brother; Lung cancer in her  mother. No Known Allergies Current Outpatient Medications on File Prior to Visit  Medication Sig Dispense Refill   SYNTHROID 50 MCG tablet TAKE 1 TABLET BY MOUTH DAILY EVERY MONDAY THRU FRIDAY AND 2 TABLETS DAILY ON SATURDAYS AND SUNDAYS 108 tablet 3   fluorometholone (FML) 0.1 % ophthalmic suspension Place 1 drop into the right eye 2 (two) times daily. (Patient not taking: Reported on 03/04/2023)     No current facility-administered medications on file prior to visit.        ROS:  All others reviewed and negative.  Objective        PE:  BP 128/82 (BP Location: Left Arm, Patient Position: Sitting, Cuff Size: Normal)   Pulse 77   Temp 98.5 F (36.9 C) (Oral)   Ht 5\' 5"  (1.651 m)   Wt 144 lb (65.3 kg)   SpO2 96%   BMI 23.96 kg/m                 Constitutional: Pt appears in NAD               HENT: Head: NCAT.                Right Ear: External ear normal.  Left Ear: External ear normal.                Eyes: . Pupils are equal, round, and reactive to light. Conjunctivae and EOM are normal               Nose: without d/c or deformity               Neck: Neck supple. Gross normal ROM               Cardiovascular: Normal rate and regular rhythm.                 Pulmonary/Chest: Effort normal and breath sounds without rales or wheezing.                Abd:  Soft, NT, ND, + BS, no organomegaly               Neurological: Pt is alert. At baseline orientation, motor grossly intact               Skin: Skin is warm. No rashes, no other new lesions, LE edema - none               Psychiatric: Pt behavior is normal without agitation   Micro: none  Cardiac tracings I have personally interpreted today:  none  Pertinent Radiological findings (summarize): none   Lab Results  Component Value Date   WBC 6.3 03/04/2023   HGB 14.3 03/04/2023   HCT 41.3 03/04/2023   PLT 405.0 (H) 03/04/2023   GLUCOSE 94 03/04/2023   CHOL 222 (H) 08/05/2022   TRIG 132.0 08/05/2022   HDL 55.30  08/05/2022   LDLCALC 140 (H) 08/05/2022   ALT 20 03/04/2023   AST 21 03/04/2023   NA 137 03/04/2023   K 4.4 03/04/2023   CL 101 03/04/2023   CREATININE 0.83 03/04/2023   BUN 11 03/04/2023   CO2 27 03/04/2023   TSH 4.03 08/05/2022   HGBA1C 5.8 02/10/2023   Assessment/Plan:  Kimberly Tran is a 72 y.o. White or Caucasian [1] female with  has a past medical history of Arthritis, Atypical mole (01/30/2005), Atypical mole (10/21/2006), Atypical mole (02/15/2008), Atypical mole (11/02/2008), Cataract, Hyperlipidemia, Squamous cell carcinoma of skin (05/03/2020), and Thyroid disease.  Acute diverticulitis Improved, pt for further antibx to finish 10 day tx,  to f/u any worsening symptoms or concerns  Pre-diabetes Lab Results  Component Value Date   HGBA1C 5.8 02/10/2023   Stable, pt to continue current medical treatment  - diet, wt control  Hypothyroid Lab Results  Component Value Date   TSH 4.03 08/05/2022   Stable, pt to continue levothyroxine 50 mcg qd  Followup: Return if symptoms worsen or fail to improve.  Oliver Barre, MD 07/05/2023 4:28 PM [facility]

## 2023-07-02 NOTE — Patient Instructions (Signed)
Please take all new medication as prescribed - the antibiotic to finish full 10 days treatment for probable acute diverticulitis  Please continue all other medications as before, and refills have been done if requested.  Please have the pharmacy call with any other refills you may need.  Please keep your appointments with your specialists as you may have planned

## 2023-07-05 ENCOUNTER — Encounter: Payer: Self-pay | Admitting: Internal Medicine

## 2023-07-05 NOTE — Assessment & Plan Note (Signed)
Lab Results  Component Value Date   HGBA1C 5.8 02/10/2023   Stable, pt to continue current medical treatment   - diet, wt control

## 2023-07-05 NOTE — Assessment & Plan Note (Signed)
Improved, pt for further antibx to finish 10 day tx,  to f/u any worsening symptoms or concerns

## 2023-07-05 NOTE — Assessment & Plan Note (Signed)
Lab Results  Component Value Date   TSH 4.03 08/05/2022   Stable, pt to continue levothyroxine 50 mcg qd

## 2023-07-16 ENCOUNTER — Encounter (INDEPENDENT_AMBULATORY_CARE_PROVIDER_SITE_OTHER): Payer: Self-pay

## 2023-07-31 ENCOUNTER — Other Ambulatory Visit: Payer: Self-pay | Admitting: Obstetrics and Gynecology

## 2023-07-31 DIAGNOSIS — E041 Nontoxic single thyroid nodule: Secondary | ICD-10-CM

## 2023-07-31 DIAGNOSIS — Z8249 Family history of ischemic heart disease and other diseases of the circulatory system: Secondary | ICD-10-CM

## 2023-08-06 ENCOUNTER — Other Ambulatory Visit: Payer: Medicare HMO

## 2023-08-11 ENCOUNTER — Ambulatory Visit (INDEPENDENT_AMBULATORY_CARE_PROVIDER_SITE_OTHER): Payer: Medicare HMO | Admitting: Internal Medicine

## 2023-08-11 ENCOUNTER — Encounter: Payer: Self-pay | Admitting: Internal Medicine

## 2023-08-11 VITALS — BP 124/72 | HR 68 | Temp 98.2°F | Ht 65.0 in | Wt 140.0 lb

## 2023-08-11 DIAGNOSIS — E785 Hyperlipidemia, unspecified: Secondary | ICD-10-CM

## 2023-08-11 DIAGNOSIS — R7303 Prediabetes: Secondary | ICD-10-CM | POA: Diagnosis not present

## 2023-08-11 DIAGNOSIS — Z0000 Encounter for general adult medical examination without abnormal findings: Secondary | ICD-10-CM

## 2023-08-11 DIAGNOSIS — E039 Hypothyroidism, unspecified: Secondary | ICD-10-CM

## 2023-08-11 LAB — COMPREHENSIVE METABOLIC PANEL
ALT: 18 U/L (ref 0–35)
AST: 21 U/L (ref 0–37)
Albumin: 4.2 g/dL (ref 3.5–5.2)
Alkaline Phosphatase: 83 U/L (ref 39–117)
BUN: 11 mg/dL (ref 6–23)
CO2: 30 mEq/L (ref 19–32)
Calcium: 9.5 mg/dL (ref 8.4–10.5)
Chloride: 101 mEq/L (ref 96–112)
Creatinine, Ser: 0.78 mg/dL (ref 0.40–1.20)
GFR: 75.88 mL/min (ref >60.00)
Glucose, Bld: 104 mg/dL — ABNORMAL HIGH (ref 70–99)
Potassium: 4.2 mEq/L (ref 3.5–5.1)
Sodium: 137 mEq/L (ref 135–145)
Total Bilirubin: 0.7 mg/dL (ref 0.2–1.2)
Total Protein: 7.2 g/dL (ref 6.0–8.3)

## 2023-08-11 LAB — HEMOGLOBIN A1C: Hgb A1c MFr Bld: 5.9 % (ref 4.6–6.5)

## 2023-08-11 LAB — LIPID PANEL
Cholesterol: 243 mg/dL — ABNORMAL HIGH (ref 0–200)
HDL: 53.4 mg/dL (ref >39.00)
LDL Cholesterol: 161 mg/dL — ABNORMAL HIGH (ref 0–99)
NonHDL: 189.8
Total CHOL/HDL Ratio: 5
Triglycerides: 144 mg/dL (ref 0.0–149.0)
VLDL: 28.8 mg/dL (ref 0.0–40.0)

## 2023-08-11 LAB — CBC
HCT: 42.2 % (ref 36.0–46.0)
Hemoglobin: 14 g/dL (ref 12.0–15.0)
MCHC: 33.3 g/dL (ref 30.0–36.0)
MCV: 87.5 fl (ref 78.0–100.0)
Platelets: 373 10*3/uL (ref 150.0–400.0)
RBC: 4.82 Mil/uL (ref 3.87–5.11)
RDW: 13.6 % (ref 11.5–15.5)
WBC: 5.5 10*3/uL (ref 4.0–10.5)

## 2023-08-11 LAB — TSH: TSH: 3.04 u[IU]/mL (ref 0.35–5.50)

## 2023-08-11 MED ORDER — SYNTHROID 50 MCG PO TABS: ORAL_TABLET | ORAL | 3 refills | Status: DC

## 2023-08-11 NOTE — Assessment & Plan Note (Signed)
Flu shot yearly. Pneumonia complete. Shingrix complete. Tetanus up to date. Colonoscopy up to date. Mammogram up to date, pap smear up to date and dexa up to date. Counseled about sun safety and mole surveillance. Counseled about the dangers of distracted driving. Given 10 year screening recommendations.

## 2023-08-11 NOTE — Assessment & Plan Note (Signed)
Checking TSH and adjust synthroid 50 mcg 5 days per week and 100 mcg 2 days per week. Refilled. Has upcoming US thyroid in 1-2 weeks.

## 2023-08-11 NOTE — Assessment & Plan Note (Signed)
Checking HgA1c and adjust as needed.  

## 2023-08-11 NOTE — Assessment & Plan Note (Signed)
Checking lipid panel and adjust as needed. Not on medication.  

## 2023-08-11 NOTE — Progress Notes (Signed)
   Subjective:   Patient ID: Kimberly Tran, female    DOB: 05/23/1951, 72 y.o.   MRN: [id number]  HPI The patient is here for physical.  PMH, [facility], social history reviewed and updated  Review of Systems  Constitutional: Negative.   HENT: Negative.    Eyes: Negative.   Respiratory:  Negative for cough, chest tightness and shortness of breath.   Cardiovascular:  Negative for chest pain, palpitations and leg swelling.  Gastrointestinal:  Negative for abdominal distention, abdominal pain, constipation, diarrhea, nausea and vomiting.  Musculoskeletal: Negative.   Skin: Negative.   Neurological: Negative.   Psychiatric/Behavioral: Negative.      Objective:  Physical Exam Constitutional:      Appearance: She is well-developed.  HENT:     Head: Normocephalic and atraumatic.  Cardiovascular:     Rate and Rhythm: Normal rate and regular rhythm.  Pulmonary:     Effort: Pulmonary effort is normal. No respiratory distress.     Breath sounds: Normal breath sounds. No wheezing or rales.  Abdominal:     General: Bowel sounds are normal. There is no distension.     Palpations: Abdomen is soft.     Tenderness: There is no abdominal tenderness. There is no rebound.  Musculoskeletal:     Cervical back: Normal range of motion.  Skin:    General: Skin is warm and dry.  Neurological:     Mental Status: She is alert and oriented to person, place, and time.     Coordination: Coordination normal.     Vitals:   08/11/23 0905 08/11/23 0933  BP: (!) 142/96 124/72  Pulse: 68   Temp: 98.2 F (36.8 C)   TempSrc: Oral   SpO2: 99%   Weight: 140 lb (63.5 kg)   Height: 5\' 5"  (1.651 m)     Assessment & Plan:

## 2023-08-20 ENCOUNTER — Ambulatory Visit
Admission: RE | Admit: 2023-08-20 | Discharge: 2023-08-20 | Disposition: A | Discharge status: Home / Self Care | Payer: Medicare HMO | Source: Ambulatory Visit | Attending: Obstetrics and Gynecology | Admitting: Obstetrics and Gynecology

## 2023-08-20 DIAGNOSIS — E041 Nontoxic single thyroid nodule: Secondary | ICD-10-CM

## 2023-09-10 ENCOUNTER — Other Ambulatory Visit: Payer: Self-pay | Admitting: Obstetrics and Gynecology

## 2023-09-10 DIAGNOSIS — Z1231 Encounter for screening mammogram for malignant neoplasm of breast: Secondary | ICD-10-CM

## 2023-10-22 ENCOUNTER — Ambulatory Visit: Payer: Medicare HMO

## 2023-11-19 ENCOUNTER — Ambulatory Visit
Admission: RE | Admit: 2023-11-19 | Discharge: 2023-11-19 | Disposition: A | Discharge status: Home / Self Care | Payer: Medicare HMO | Source: Ambulatory Visit | Attending: Obstetrics and Gynecology | Admitting: Obstetrics and Gynecology

## 2023-11-19 DIAGNOSIS — Z1231 Encounter for screening mammogram for malignant neoplasm of breast: Secondary | ICD-10-CM

## 2024-04-23 ENCOUNTER — Encounter: Payer: Self-pay | Admitting: Family

## 2024-04-23 ENCOUNTER — Ambulatory Visit (INDEPENDENT_AMBULATORY_CARE_PROVIDER_SITE_OTHER): Admitting: Family

## 2024-04-23 VITALS — BP 167/82 | HR 88 | Temp 98.6°F | Ht 65.0 in | Wt 143.0 lb

## 2024-04-23 DIAGNOSIS — R1033 Periumbilical pain: Secondary | ICD-10-CM | POA: Diagnosis not present

## 2024-04-23 LAB — CBC WITH DIFFERENTIAL/PLATELET
Basophils Absolute: 0.1 10*3/uL (ref 0.0–0.1)
Basophils Relative: 0.7 % (ref 0.0–3.0)
Eosinophils Absolute: 0.1 10*3/uL (ref 0.0–0.7)
Eosinophils Relative: 1.1 % (ref 0.0–5.0)
HCT: 42.8 % (ref 36.0–46.0)
Hemoglobin: 14.6 g/dL (ref 12.0–15.0)
Lymphocytes Relative: 16.6 % (ref 12.0–46.0)
Lymphs Abs: 1.8 10*3/uL (ref 0.7–4.0)
MCHC: 34 g/dL (ref 30.0–36.0)
MCV: 87 fl (ref 78.0–100.0)
Monocytes Absolute: 0.7 10*3/uL (ref 0.1–1.0)
Monocytes Relative: 6.6 % (ref 3.0–12.0)
Neutro Abs: 8.1 10*3/uL — ABNORMAL HIGH (ref 1.4–7.7)
Neutrophils Relative %: 75 % (ref 43.0–77.0)
Platelets: 364 10*3/uL (ref 150.0–400.0)
RBC: 4.93 Mil/uL (ref 3.87–5.11)
RDW: 13 % (ref 11.5–15.5)
WBC: 10.9 10*3/uL — ABNORMAL HIGH (ref 4.0–10.5)

## 2024-04-23 NOTE — Progress Notes (Signed)
 Patient ID: Kimberly Tran, female    DOB: [birth year], 73 y.o.   MRN: [id number]  Chief Complaint  Patient presents with  . Abdominal Pain    Pt c/o lower Abdominal pain and nausea, Present for 1 week. Pt c/o Slight gas at night.   Discussed the use of AI scribe software for clinical note transcription with the patient, who gave verbal consent to proceed.  History of Present Illness Kimberly Tran is a 73 year old female with diverticulitis who presents with severe abdominal pain and high blood pressure.  She experiences severe gas pains in the lower abdomen for the past week, differing from her usual diverticulitis episodes as there was no preceding sharp pain. The pain worsens when lying on her right side and is also present on the left. Eating does not worsen the pain and sometimes alleviates nausea. Despite bowel movements, the pain and bloating persist. She has slight nausea and a 'funny' feeling in her head, which she associates with elevated blood pressure. Bowel movements have been irregular, with a small, hard movement in the morning followed by a more satisfactory one later. She avoids over-the-counter medications and maintains a consistent diet without increased gassy foods. No fever, chills, or significant dietary changes are noted. Her last diverticulitis episode was a year ago, treated with antibiotics, which she did not complete as she felt better. She brought the unused medications to the visit.  Assessment & Plan Abdominal pain with possible diverticulitis Abdominal pain with history of diverticulitis, presenting with gas pains, slight nausea, and lower abdominal discomfort. Differential includes diverticulitis, gas buildup, or other GI issues. No fever or chills. Discussed inflammation vs. infection in diverticulitis and dietary changes. Preference to avoid antibiotics unless necessary. - Order CBC to assess for infection or inflammation. - Consider CT scan if symptoms  persist or worsen. - Recommend mostly liquid diet for 1-2 days, avoid gas-inducing foods. - Consider OTC simethicone for gas relief. - If antibiotics necessary, consider Cipro  and metronidazole  for 5-7 days.  Situational Hypertension - Elevated blood pressure possibly related to abdominal pain and discomfort. - Advised on low sodium diet, increased water intake, 2 L daily.       Subjective:     Outpatient Medications Prior to Visit  Medication Sig Dispense Refill  . fluorometholone (FML) 0.1 % ophthalmic suspension Place 1 drop into the right eye 2 (two) times daily.    . SYNTHROID  50 MCG tablet TAKE 1 TABLET BY MOUTH DAILY EVERY MONDAY THRU FRIDAY AND 2 TABLETS DAILY ON SATURDAYS AND SUNDAYS 108 tablet 3   No facility-administered medications prior to visit.   Past Medical History:  Diagnosis Date  . Arthritis    fingers  . Atypical mole 01/30/2005   slight atypia on abdomen  . Atypical mole 10/21/2006   slight to moderate atypia on left calf  . Atypical mole 02/15/2008   slight to moderate atypia on right anterior thigh  . Atypical mole 11/02/2008   slight atypia on right buttock  . Cataract   . Hyperlipidemia   . Squamous cell carcinoma of skin 05/03/2020   Atypical squamous proliferation-left thigh posterior- cx3, cautery 18fu  . Thyroid  disease    Past Surgical History:  Procedure Laterality Date  . COLONOSCOPY    . HERNIA REPAIR     No Known Allergies    Objective:    Physical Exam Vitals and nursing note reviewed.  Constitutional:      Appearance: Normal appearance.  Cardiovascular:  Rate and Rhythm: Normal rate and regular rhythm.  Pulmonary:     Effort: Pulmonary effort is normal.     Breath sounds: Normal breath sounds.  Abdominal:     General: There is distension.     Tenderness: There is abdominal tenderness in the periumbilical area and left lower quadrant. There is guarding. Negative signs include Murphy's sign and McBurney's sign.      Hernia: No hernia is present.  Musculoskeletal:        General: Normal range of motion.  Skin:    General: Skin is warm and dry.  Neurological:     Mental Status: She is alert.  Psychiatric:        Mood and Affect: Mood normal.        Behavior: Behavior normal.   BP (!) 172/98 (BP Location: Left Arm, Patient Position: Sitting, Cuff Size: Large)   Pulse 88   Temp 98.6 F (37 C) (Temporal)   Ht 5\' 5"  (1.651 m)   Wt 143 lb (64.9 kg)   SpO2 99%   BMI 23.80 kg/m  Wt Readings from Last 3 Encounters:  04/23/24 143 lb (64.9 kg)  08/11/23 140 lb (63.5 kg)  07/02/23 144 lb (65.3 kg)       Versa Gore, NP

## 2024-04-26 ENCOUNTER — Ambulatory Visit (INDEPENDENT_AMBULATORY_CARE_PROVIDER_SITE_OTHER): Payer: Medicare HMO

## 2024-04-26 VITALS — Ht 66.0 in | Wt 143.0 lb

## 2024-04-26 DIAGNOSIS — Z0000 Encounter for general adult medical examination without abnormal findings: Secondary | ICD-10-CM | POA: Diagnosis not present

## 2024-04-26 NOTE — Patient Instructions (Signed)
 Kimberly Tran , Thank you for taking time out of your busy schedule to complete your Annual Wellness Visit with me. I enjoyed our conversation and look forward to speaking with you again next year. I, as well as your care team,  appreciate your ongoing commitment to your health goals. Please review the following plan we discussed and let me know if I can assist you in the future. Your Game plan/ To Do List   Follow up Visits: Next Medicare AWV with our clinical staff: Pt declined to schedule 2026 AWV at this time.  Will schedule later.   Have you seen your provider in the last 6 months (3 months if uncontrolled diabetes)? No Next Office Visit with your provider: 08/12/2024  Clinician Recommendations:  Aim for 30 minutes of exercise or brisk walking, 6-8 glasses of water, and 5 servings of fruits and vegetables each day.       This is a list of the screening recommended for you and due dates:  Health Maintenance  Topic Date Due   Flu Shot  07/16/2024   Medicare Annual Wellness Visit  04/26/2025   Mammogram  11/18/2025   Colon Cancer Screening  07/21/2028   DTaP/Tdap/Td vaccine (2 - Td or Tdap) 01/16/2031   Pneumonia Vaccine  Completed   DEXA scan (bone density measurement)  Completed   Hepatitis C Screening  Completed   HPV Vaccine  Aged Out   Meningitis B Vaccine  Aged Out   COVID-19 Vaccine  Discontinued   Zoster (Shingles) Vaccine  Discontinued    Advanced directives: (Copy Requested) Please bring a copy of your health care power of attorney and living will to the office to be added to your chart at your convenience. You can mail to [facility] [address] [place], [place] [address] or email to [email or web address] Advance Care Planning is important because it:  [x]  Makes sure you receive the medical care that is consistent with your values, goals, and preferences  [x]  It provides guidance to your family and loved ones and reduces their decisional burden  about whether or not they are making the right decisions based on your wishes.  Follow the link provided in your after visit summary or read over the paperwork we have mailed to you to help you started getting your Advance Directives in place. If you need assistance in completing these, please reach out to us  so that we can help you!

## 2024-04-26 NOTE — Progress Notes (Signed)
 Subjective:  Please attest and cosign this visit due to patients primary care provider not being in the office at the time the visit was completed.  (Pt of Dr Bambi Lever)   Kimberly Tran is a 73 y.o. who presents for a Medicare Wellness preventive visit.  As a reminder, Annual Wellness Visits don't include a physical exam, and some assessments may be limited, especially if this visit is performed virtually. We may recommend an in-person visit if needed.  Visit Complete: Virtual I connected with  Kimberly Tran on 04/26/24 by a audio enabled telemedicine application and verified that I am speaking with the correct person using two identifiers.  Patient Location: Home  Provider Location: Office/Clinic  I discussed the limitations of evaluation and management by telemedicine. The patient expressed understanding and agreed to proceed.  Vital Signs: Because this visit was a virtual/telehealth visit, some criteria may be missing or patient reported. Any vitals not documented were not able to be obtained and vitals that have been documented are patient reported.  VideoDeclined- This patient declined Librarian, academic. Therefore the visit was completed with audio only.  Persons Participating in Visit: Patient.  AWV Questionnaire: No: Patient Medicare AWV questionnaire was not completed prior to this visit.  Cardiac Risk Factors include: advanced age (>17men, >102 women);dyslipidemia     Objective:     Today's Vitals   04/26/24 1327  Weight: 143 lb (64.9 kg)  Height: 5\' 6"  (1.676 m)   Body mass index is 23.08 kg/m.     04/26/2024    1:27 PM 04/23/2023    2:05 PM 04/03/2022    2:03 PM  Advanced Directives  Does Patient Have a Medical Advance Directive? Yes Yes Yes  Type of Estate agent of [place];Living will Healthcare Power of [place];Living will Living will;Healthcare Power of Attorney  Does patient want to make  changes to medical advance directive?   No - Patient declined  Copy of Healthcare Power of Attorney in Chart? No - copy requested No - copy requested No - copy requested    Current Medications (verified) Outpatient Encounter Medications as of 04/26/2024  Medication Sig   SYNTHROID  50 MCG tablet TAKE 1 TABLET BY MOUTH DAILY EVERY MONDAY THRU FRIDAY AND 2 TABLETS DAILY ON SATURDAYS AND SUNDAYS   [DISCONTINUED] fluorometholone (FML) 0.1 % ophthalmic suspension Place 1 drop into the right eye 2 (two) times daily.   No facility-administered encounter medications on file as of 04/26/2024.    Allergies (verified) Patient has no known allergies.   History: Past Medical History:  Diagnosis Date   Arthritis    fingers   Atypical mole 01/30/2005   slight atypia on abdomen   Atypical mole 10/21/2006   slight to moderate atypia on left calf   Atypical mole 02/15/2008   slight to moderate atypia on right anterior thigh   Atypical mole 11/02/2008   slight atypia on right buttock   Cataract    Hyperlipidemia    Squamous cell carcinoma of skin 05/03/2020   Atypical squamous proliferation-left thigh posterior- cx3, cautery 53fu   Thyroid  disease    Past Surgical History:  Procedure Laterality Date   COLONOSCOPY     HERNIA REPAIR     Family History  Problem Relation Age of Onset   Lung cancer Mother    Diabetes Father    Heart disease Father    Hypertension Brother    Colon cancer Neg Hx    Colon polyps  Neg Hx    Esophageal cancer Neg Hx    Rectal cancer Neg Hx    Stomach cancer Neg Hx    Social History   Socioeconomic History   Marital status: Single    Spouse name: Not on file   Number of children: Not on file   Years of education: Not on file   Highest education level: Not on file  Occupational History   Not on file  Tobacco Use   Smoking status: Never    Passive exposure: Never   Smokeless tobacco: Never  Vaping Use   Vaping status: Never Used  Substance and Sexual  Activity   Alcohol use: No   Drug use: Never   Sexual activity: Not Currently  Other Topics Concern   Not on file  Social History Narrative   Single   Social Drivers of Health   Financial Resource Strain: Low Risk  (04/26/2024)   Overall Financial Resource Strain (CARDIA)    Difficulty of Paying Living Expenses: Not hard at all  Food Insecurity: No Food Insecurity (04/26/2024)   Hunger Vital Sign    Worried About Running Out of Food in the Last Year: Never true    Ran Out of Food in the Last Year: Never true  Transportation Needs: No Transportation Needs (04/26/2024)   PRAPARE - Administrator, Civil Service (Medical): No    Lack of Transportation (Non-Medical): No  Physical Activity: Sufficiently Active (04/26/2024)   Exercise Vital Sign    Days of Exercise per Week: 7 days    Minutes of Exercise per Session: 60 min  Stress: No Stress Concern Present (04/26/2024)   [employer] of Occupational Health - Occupational Stress Questionnaire    Feeling of Stress : Not at all  Social Connections: Moderately Integrated (04/26/2024)   Social Connection and Isolation Panel [NHANES]    Frequency of Communication with Friends and Family: More than three times a week    Frequency of Social Gatherings with Friends and Family: More than three times a week    Attends Religious Services: More than 4 times per year    Active Member of [employer] or Organizations: Yes    Attends Engineer, structural: More than 4 times per year    Marital Status: Never married    Tobacco Counseling Counseling given: No    Clinical Intake:  Pre-visit preparation completed: Yes  Pain : No/denies pain     BMI - recorded: 23.08 Nutritional Status: BMI of 19-24  Normal Nutritional Risks: None Diabetes: No  Lab Results  Component Value Date   HGBA1C 5.9 08/11/2023   HGBA1C 5.8 02/10/2023   HGBA1C 6.2 08/05/2022     How often do you need to have someone help you when you read  instructions, pamphlets, or other written materials from your doctor or pharmacy?: 1 - Never  Interpreter Needed?: No  Information entered by :: Kandy Orris, CMA   Activities of Daily Living     04/26/2024    1:29 PM  In your present state of health, do you have any difficulty performing the following activities:  Hearing? 0  Vision? 0  Difficulty concentrating or making decisions? 0  Walking or climbing stairs? 0  Dressing or bathing? 0  Doing errands, shopping? 0  Preparing Food and eating ? N  Using the Toilet? N  In the past six months, have you accidently leaked urine? N  Do you have problems with loss of bowel control? N  Managing your Medications? N  Managing your Finances? N  Housekeeping or managing your Housekeeping? N    Patient Care Team: Adelia Homestead, MD as PCP - General (Internal Medicine) Devon Fogo, MD (Inactive) as Consulting Physician (Dermatology) Dema Filler, MD as Consulting Physician (Ophthalmology)  Indicate any recent Medical Services you may have received from other than [facility] providers in the past year (date may be approximate).     Assessment:    This is a routine wellness examination for [place].  Hearing/Vision screen Hearing Screening - Comments:: Denies hearing difficulties   Vision Screening - Comments:: Denies Vision concerns   Goals Addressed               This Visit's Progress     Patient Stated (pt-stated)        Patient stated she plans to stay active.       Depression Screen     04/26/2024    1:33 PM 07/02/2023    8:49 AM 04/23/2023    2:06 PM 03/04/2023   10:49 AM 08/05/2022    9:14 AM 05/16/2022   11:55 AM 04/03/2022    2:09 PM  PHQ 2/9 Scores  PHQ - 2 Score 0 0 0 0 0 0 0  PHQ- 9 Score 0  0 0       Fall Risk     04/26/2024    1:30 PM 07/02/2023    8:49 AM 04/23/2023    2:06 PM 03/04/2023   10:49 AM 02/10/2023    9:13 AM  Fall Risk   Falls in the past year? 0 0 0 0 0  Number falls in past yr: 0  0 0 0 0  Injury with Fall? 0 0 0 0 0  Risk for fall due to : No Fall Risks No Fall Risks No Fall Risks No Fall Risks   Follow up Falls prevention discussed;Falls evaluation completed Falls evaluation completed Falls prevention discussed Falls evaluation completed;Education provided Falls evaluation completed    MEDICARE RISK AT HOME:  Medicare Risk at Home Any stairs in or around the home?: Yes (outside) If so, are there any without handrails?: No Home free of loose throw rugs in walkways, pet beds, electrical cords, etc?: Yes Adequate lighting in your home to reduce risk of falls?: Yes Life alert?: No Use of a cane, walker or w/c?: No Grab bars in the bathroom?: No Shower chair or bench in shower?: No Elevated toilet seat or a handicapped toilet?: No  TIMED UP AND GO:  Was the test performed?  No  Cognitive Function: 6CIT completed        04/26/2024    1:33 PM 04/23/2023    2:06 PM 04/03/2022    2:13 PM  6CIT Screen  What Year? 0 points 0 points 0 points  What month? 0 points 0 points 0 points  What time? 0 points 0 points 0 points  Count back from 20 0 points 0 points 0 points  Months in reverse 0 points 0 points 0 points  Repeat phrase 0 points 0 points 0 points  Total Score 0 points 0 points 0 points    Immunizations Immunization History  Administered Date(s) Administered   Pneumococcal Conjugate-13 11/24/2019   Pneumococcal Polysaccharide-23 07/30/2021   Tdap 01/16/2021    Screening Tests Health Maintenance  Topic Date Due   INFLUENZA VACCINE  07/16/2024   Medicare Annual Wellness (AWV)  04/26/2025   MAMMOGRAM  11/18/2025   Colonoscopy  07/21/2028   DTaP/Tdap/Td (  2 - Td or Tdap) 01/16/2031   Pneumonia Vaccine 1+ Years old  Completed   DEXA SCAN  Completed   Hepatitis C Screening  Completed   HPV VACCINES  Aged Out   Meningococcal B Vaccine  Aged Out   COVID-19 Vaccine  Discontinued   Zoster Vaccines- Shingrix  Discontinued    Health  Maintenance  There are no preventive care reminders to display for this patient. Health Maintenance Items Addressed: 04/26/2024   Additional Screening:  Vision Screening: Recommended annual ophthalmology exams for early detection of glaucoma and other disorders of the eye.  Dental Screening: Recommended annual dental exams for proper oral hygiene  Community Resource Referral / Chronic Care Management: CRR required this visit?  No   CCM required this visit?  No   Plan:    I have personally reviewed and noted the following in the patient's chart:   Medical and social history Use of alcohol, tobacco or illicit drugs  Current medications and supplements including opioid prescriptions. Patient is not currently taking opioid prescriptions. Functional ability and status Nutritional status Physical activity Advanced directives List of other physicians Hospitalizations, surgeries, and ER visits in previous 12 months Vitals Screenings to include cognitive, depression, and falls Referrals and appointments  In addition, I have reviewed and discussed with patient certain preventive protocols, quality metrics, and best practice recommendations. A written personalized care plan for preventive services as well as general preventive health recommendations were provided to patient.   Patria Bookbinder, CMA   04/26/2024   After Visit Summary: (MyChart) Due to this being a telephonic visit, the after visit summary with patients personalized plan was offered to patient via MyChart   Notes: Nothing significant to report at this time.

## 2024-08-12 ENCOUNTER — Encounter: Payer: Medicare HMO | Admitting: Internal Medicine

## 2024-08-18 ENCOUNTER — Ambulatory Visit: Admitting: Internal Medicine

## 2024-08-18 ENCOUNTER — Encounter: Payer: Self-pay | Admitting: Internal Medicine

## 2024-08-18 VITALS — BP 128/84 | HR 71 | Temp 98.4°F | Ht 66.0 in | Wt 139.0 lb

## 2024-08-18 DIAGNOSIS — R7303 Prediabetes: Secondary | ICD-10-CM

## 2024-08-18 DIAGNOSIS — Z0000 Encounter for general adult medical examination without abnormal findings: Secondary | ICD-10-CM | POA: Diagnosis not present

## 2024-08-18 DIAGNOSIS — E039 Hypothyroidism, unspecified: Secondary | ICD-10-CM | POA: Diagnosis not present

## 2024-08-18 DIAGNOSIS — E785 Hyperlipidemia, unspecified: Secondary | ICD-10-CM

## 2024-08-18 LAB — CBC
HCT: 42.1 % (ref 36.0–46.0)
Hemoglobin: 14.2 g/dL (ref 12.0–15.0)
MCHC: 33.7 g/dL (ref 30.0–36.0)
MCV: 86.6 fl (ref 78.0–100.0)
Platelets: 377 K/uL (ref 150.0–400.0)
RBC: 4.86 Mil/uL (ref 3.87–5.11)
RDW: 13.4 % (ref 11.5–15.5)
WBC: 4.8 K/uL (ref 4.0–10.5)

## 2024-08-18 LAB — COMPREHENSIVE METABOLIC PANEL WITH GFR
ALT: 16 U/L (ref 0–35)
AST: 20 U/L (ref 0–37)
Albumin: 4.2 g/dL (ref 3.5–5.2)
Alkaline Phosphatase: 82 U/L (ref 39–117)
BUN: 7 mg/dL (ref 6–23)
CO2: 29 meq/L (ref 19–32)
Calcium: 9.1 mg/dL (ref 8.4–10.5)
Chloride: 100 meq/L (ref 96–112)
Creatinine, Ser: 0.75 mg/dL (ref 0.40–1.20)
GFR: 78.97 mL/min (ref >60.00)
Glucose, Bld: 107 mg/dL — ABNORMAL HIGH (ref 70–99)
Potassium: 4.2 meq/L (ref 3.5–5.1)
Sodium: 136 meq/L (ref 135–145)
Total Bilirubin: 0.8 mg/dL (ref 0.2–1.2)
Total Protein: 7.1 g/dL (ref 6.0–8.3)

## 2024-08-18 LAB — LIPID PANEL
Cholesterol: 224 mg/dL — ABNORMAL HIGH (ref 0–200)
HDL: 58 mg/dL (ref >39.00)
LDL Cholesterol: 143 mg/dL — ABNORMAL HIGH (ref 0–99)
NonHDL: 165.84
Total CHOL/HDL Ratio: 4
Triglycerides: 112 mg/dL (ref 0.0–149.0)
VLDL: 22.4 mg/dL (ref 0.0–40.0)

## 2024-08-18 LAB — TSH: TSH: 3.53 u[IU]/mL (ref 0.35–5.50)

## 2024-08-18 LAB — HEMOGLOBIN A1C: Hgb A1c MFr Bld: 6.3 % (ref 4.6–6.5)

## 2024-08-18 MED ORDER — SYNTHROID 50 MCG PO TABS: ORAL_TABLET | ORAL | 3 refills | Status: AC

## 2024-08-18 NOTE — Assessment & Plan Note (Signed)
 Flu shot yearly. Pneumonia complete. Shingrix  complete. Tetanus up to date. Colonoscopy up to date. Mammogram up to date, pap smear aged out and dexa complete. Counseled about sun safety and mole surveillance. Counseled about the dangers of distracted driving. Given 10 year screening recommendations.

## 2024-08-18 NOTE — Progress Notes (Signed)
   Subjective:   Patient ID: Kimberly Tran, female    DOB: 1951-01-31, 73 y.o.   MRN: [id number]  The patient is here for physical. Pertinent topics discussed: Discussed the use of AI scribe software for clinical note transcription with the patient, who gave verbal consent to proceed.  History of Present Illness Kimberly Tran is a 73 year old female who presents for a routine follow-up visit.  She regularly visits a dermatologist for actinic keratosis, treated with cryotherapy. No cancerous lesions have been found.  She engages in regular physical activity, including yard work, walking, and dancing. No new dizziness or balance problems.    PMH, [facility], social history reviewed and updated  Review of Systems  Constitutional: Negative.   HENT: Negative.    Eyes: Negative.   Respiratory:  Negative for cough, chest tightness and shortness of breath.   Cardiovascular:  Negative for chest pain, palpitations and leg swelling.  Gastrointestinal:  Negative for abdominal distention, abdominal pain, constipation, diarrhea, nausea and vomiting.  Musculoskeletal: Negative.   Skin: Negative.   Neurological: Negative.   Psychiatric/Behavioral: Negative.      Objective:  Physical Exam Constitutional:      Appearance: She is well-developed.  HENT:     Head: Normocephalic and atraumatic.  Cardiovascular:     Rate and Rhythm: Normal rate and regular rhythm.  Pulmonary:     Effort: Pulmonary effort is normal. No respiratory distress.     Breath sounds: Normal breath sounds. No wheezing or rales.  Abdominal:     General: Bowel sounds are normal. There is no distension.     Palpations: Abdomen is soft.     Tenderness: There is no abdominal tenderness. There is no rebound.  Musculoskeletal:     Cervical back: Normal range of motion.  Skin:    General: Skin is warm and dry.  Neurological:     Mental Status: She is alert and oriented to person, place, and time.     Coordination:  Coordination normal.     Vitals:   08/18/24 0902  BP: 128/84  Pulse: 71  Temp: 98.4 F (36.9 C)  TempSrc: Oral  SpO2: 98%  Weight: 139 lb (63 kg)  Height: 5' 6 (1.676 m)    Assessment & Plan:

## 2024-08-18 NOTE — Assessment & Plan Note (Signed)
Checking lipid panel and adjust as needed not on meds.

## 2024-08-18 NOTE — Assessment & Plan Note (Signed)
 Checking TSH and adjust synthroid  50 mcg as needed takes 1 pill m-f and 2 pills sat/sun.

## 2024-08-18 NOTE — Assessment & Plan Note (Signed)
 Checking HgA1c and adjust as needed.

## 2024-08-19 ENCOUNTER — Ambulatory Visit: Payer: Self-pay | Admitting: Internal Medicine

## 2024-09-10 ENCOUNTER — Encounter: Payer: Self-pay | Admitting: Student in an Organized Health Care Education/Training Program

## 2024-09-10 ENCOUNTER — Ambulatory Visit (INDEPENDENT_AMBULATORY_CARE_PROVIDER_SITE_OTHER): Admitting: Student in an Organized Health Care Education/Training Program

## 2024-09-10 ENCOUNTER — Ambulatory Visit: Payer: Self-pay | Admitting: *Deleted

## 2024-09-10 VITALS — BP 158/76 | HR 88 | Ht 66.0 in | Wt 139.8 lb

## 2024-09-10 DIAGNOSIS — K5792 Diverticulitis of intestine, part unspecified, without perforation or abscess without bleeding: Secondary | ICD-10-CM

## 2024-09-10 MED ORDER — CIPROFLOXACIN HCL 500 MG PO TABS: 500.0000 mg | ORAL_TABLET | Freq: Two times a day (BID) | ORAL | 0 refills | Status: AC

## 2024-09-10 MED ORDER — METRONIDAZOLE 500 MG PO TABS: 500.0000 mg | ORAL_TABLET | Freq: Three times a day (TID) | ORAL | 0 refills | Status: AC

## 2024-09-10 NOTE — Assessment & Plan Note (Signed)
 Recurrent mild diverticulitis is likely to resolve spontaneously. Chronic diverticulosis increases recurrence risk. No surgical intervention is needed. Prescribed a 5-day course of Ciprofloxacin  and Metronidazole . Recommended anti-inflammatories like ibuprofen or Tylenol  for discomfort. Advised regular, soft bowel movements using fiber or Miralax. Instructed to avoid alcohol while taking Metronidazole . Reassure that no dietary restrictions are necessary; maintain a balanced diet. Discussed the chronic nature of diverticulosis and potential for recurrence.

## 2024-09-10 NOTE — Progress Notes (Signed)
 Acute Office Visit  Subjective:     Patient ID: Kimberly Tran, female    DOB: October 28, 1951, 73 y.o.   MRN: [id number]  Chief Complaint  Patient presents with   Abdominal Pain    Sx started yesterday. Not able to have a bowel movement but has the feeling to go.     HPI  Discussed the use of AI scribe software for clinical note transcription with the patient, who gave verbal consent to proceed.  History of Present Illness Kimberly Tran is a 73 year old female with diverticulitis who presents with abdominal pain.  She began experiencing abdominal pain on Wednesday morning while [place] the beach. Initially, she felt something was not right but hoped it would resolve on its own. By Wednesday night, she felt exhausted, which was unusual for her.  She has a history of diverticulitis and had brought her prescription medication with her as a precaution. She began taking Cipro  and Flagyl  on Thursday at 3 PM, taking two doses then, two more at around 10:30 PM, and another two doses on Friday morning at 8:39 AM. She has a limited supply of these medications remaining.  In the past, her diverticulitis episodes have been localized to one spot, but this time the pain is diffuse across the lower abdomen. She experiences these episodes approximately once a year, with the last mild episode occurring in April. She recalls having a CT scan in March 2023 and another in 2024 for similar symptoms.  No fever, though she felt she might have had one on Wednesday evening. She is able to eat and drink without significant issues, though she experienced some nausea, which she attributes to the medication. She has not had a normal bowel movement since Wednesday morning, experiencing only very soft and minimal output, and feels an urgency to go after eating, but with little result.  No chronic constipation, as she usually maintains regular bowel movements with the help of yogurt. She has not experienced any  significant nausea or vomiting that prevents her from eating, and she was very hungry upon returning home.      Objective:    BP (!) 158/76 (BP Location: Left Arm, Cuff Size: Normal)   Pulse 88   Ht 5' 6 (1.676 m)   Wt 139 lb 12.8 oz (63.4 kg)   SpO2 100%   BMI 22.56 kg/m   Physical Exam  Gen: Well-appearing woman Heart: Regular, no murmur Lungs: Unlabored and clear Abd: Soft, mild tenderness to palpation in the right and left lower quadrants, no rebound tenderness, no guarding, no peritoneal signs.  Dull to percussion. Ext: Warm, no edema      Assessment & Plan:    Problem List Items Addressed This Visit       Unprioritized   Acute diverticulitis - Primary   Recurrent mild diverticulitis is likely to resolve spontaneously. Chronic diverticulosis increases recurrence risk. No surgical intervention is needed. Prescribed a 5-day course of Ciprofloxacin  and Metronidazole . Recommended anti-inflammatories like ibuprofen or Tylenol  for discomfort. Advised regular, soft bowel movements using fiber or Miralax. Instructed to avoid alcohol while taking Metronidazole . Reassure that no dietary restrictions are necessary; maintain a balanced diet. Discussed the chronic nature of diverticulosis and potential for recurrence.      Relevant Medications   ciprofloxacin  (CIPRO ) 500 MG tablet   metroNIDAZOLE  (FLAGYL ) 500 MG tablet    Meds ordered this encounter  Medications   ciprofloxacin  (CIPRO ) 500 MG tablet    Sig: Take 1  tablet (500 mg total) by mouth 2 (two) times daily for 5 days.    Dispense:  10 tablet    Refill:  0   metroNIDAZOLE  (FLAGYL ) 500 MG tablet    Sig: Take 1 tablet (500 mg total) by mouth 3 (three) times daily for 5 days.    Dispense:  15 tablet    Refill:  0    Return if symptoms worsen or fail to improve.  Cleatus Debby Specking, MD

## 2024-09-10 NOTE — Telephone Encounter (Signed)
 FYI Only or Action Required?: FYI only for provider.  Patient was last seen in primary care on 08/18/2024 by Rollene Almarie LABOR, MD.  Called Nurse Triage reporting Abdominal Pain.  Symptoms began several days ago.  Interventions attempted: Rest, hydration, or home remedies.  Symptoms are: unchanged.  Triage Disposition: See HCP Within 4 Hours (Or PCP Triage)  Patient/caregiver understands and will follow disposition?: yes- patient scheduled first availablae appointment   Reason for Disposition  [1] MILD-MODERATE pain AND [2] constant AND [3] age > 60 years  Answer Assessment - Initial Assessment Questions 1. LOCATION: Where does it hurt?      All over lower abdomen  3. ONSET: When did the pain begin? (e.g., minutes, hours or days ago)      Wednesday am 4. SUDDEN: Gradual or sudden onset?     gradual 5. PATTERN Does the pain come and go, or is it constant?     Constant 6. SEVERITY: How bad is the pain?  (e.g., Scale 1-10; mild, moderate, or severe)     2/10 7. RECURRENT SYMPTOM: Have you ever had this type of stomach pain before? If Yes, ask: When was the last time? and What happened that time?      Yes- diverticulitis 8. CAUSE: What do you think is causing the stomach pain? (e.g., gallstones, recent abdominal surgery)     Possible diverticulitis 9. RELIEVING/AGGRAVATING FACTORS: What makes it better or worse? (e.g., antacids, bending or twisting motion, bowel movement)     no 10. OTHER SYMPTOMS: Do you have any other symptoms? (e.g., back pain, diarrhea, fever, urination pain, vomiting)       No BM-since Wednesday  Protocols used: Abdominal Pain - [facility]   Copied from CRM #[id number]. Topic: Clinical - Red Word Triage >> Sep 10, 2024  8:07 AM Suzen RAMAN wrote: Red Word that prompted transfer to Nurse Triage: pain in lower abdomen, incontinents. Requesting an appt for today

## 2024-09-10 NOTE — Patient Instructions (Signed)
  VISIT SUMMARY: Today, we discussed your recent episode of abdominal pain, which you experienced while at the beach. You have a history of diverticulitis and began taking your prescribed medications, Cipro  and Flagyl , when the pain started. Your symptoms include diffuse lower abdominal pain, mild nausea, and changes in bowel movements. You have not had a fever, and you are able to eat and drink without significant issues.  YOUR PLAN: -RECURRENT MILD LEFT-SIDED DIVERTICULITIS: Diverticulitis is an inflammation or infection of small pouches that can form in your intestines. Your current episode is mild and likely to resolve on its own. We will continue with a 5-day course of Ciprofloxacin  and Metronidazole . For discomfort, you can take ibuprofen or Tylenol . To help with bowel movements, use fiber or Miralax. Avoid alcohol while taking Metronidazole . No dietary restrictions are necessary, but maintain a balanced diet. Be aware that chronic diverticulosis can increase the risk of recurrence.  INSTRUCTIONS: Please complete the 5-day course of Ciprofloxacin  and Metronidazole  as prescribed. Use ibuprofen or Tylenol  for any discomfort. To ensure regular, soft bowel movements, incorporate fiber or Miralax into your routine. Avoid alcohol while taking Metronidazole . Maintain a balanced diet and monitor your symptoms. If your condition worsens or does not improve, please schedule a follow-up appointment.

## 2024-10-08 ENCOUNTER — Ambulatory Visit: Payer: Self-pay

## 2024-10-08 ENCOUNTER — Ambulatory Visit (INDEPENDENT_AMBULATORY_CARE_PROVIDER_SITE_OTHER): Admitting: Nurse Practitioner

## 2024-10-08 VITALS — BP 132/84 | HR 98 | Temp 97.9°F | Ht 66.0 in | Wt 140.4 lb

## 2024-10-08 DIAGNOSIS — R220 Localized swelling, mass and lump, head: Secondary | ICD-10-CM

## 2024-10-08 MED ORDER — PREDNISONE 20 MG PO TABS: 40.0000 mg | ORAL_TABLET | Freq: Every day | ORAL | 0 refills | Status: AC

## 2024-10-08 NOTE — Assessment & Plan Note (Signed)
 Acute, etiology unclear Treat with oral prednisone 40mg /day x 5 days Possibly allergic in origin so also take daily antihistamine for 5-7 days Call if symptoms change/worsen/do not improve. Consider additional evaluation/referral pending progression of signs/symptoms if needed Discussed possible side effects/risks of prednisone and what to do if they were to occur

## 2024-10-08 NOTE — Progress Notes (Signed)
   Established Patient Office Visit  Subjective   Patient ID: Kimberly Tran, female    DOB: June 28, 1951  Age: 73 y.o. MRN: [id number]  Chief Complaint  Patient presents with   Oral Swelling    Lip Swelling -Acute issue, started 2 weeks ago.  -No known exposures to new topical products.  -No pain, some itching sensations -Med Hx of genital herpes, but has never had oral lesions -No new medications -No known bug/animal bites -No fever, no wheezing, no throat swelling, no sore throat, no SOB, no chest pain -Swelling is unilateral and affecting left side of upper lip -Treated with cipro  and metronidazole  about 1 week prior to swelling for acute diverticulitis    ROS: see HPI    Objective:     BP 132/84   Pulse 98   Temp 97.9 F (36.6 C) (Temporal)   Ht 5' 6 (1.676 m)   Wt 140 lb 6 oz (63.7 kg)   SpO2 99%   BMI 22.66 kg/m    Physical Exam Vitals reviewed.  Constitutional:      General: She is not in acute distress.    Appearance: Normal appearance.  HENT:     Head: Normocephalic and atraumatic.      Mouth/Throat:     Pharynx: Oropharynx is clear. No pharyngeal swelling or oropharyngeal exudate.  Cardiovascular:     Rate and Rhythm: Normal rate and regular rhythm.     Pulses: Normal pulses.     Heart sounds: Normal heart sounds.  Pulmonary:     Effort: Pulmonary effort is normal.     Breath sounds: Normal breath sounds.  Lymphadenopathy:     Cervical: No cervical adenopathy.  Skin:    General: Skin is warm and dry.  Neurological:     General: No focal deficit present.     Mental Status: She is alert and oriented to person, place, and time.  Psychiatric:        Mood and Affect: Mood normal.        Behavior: Behavior normal.        Judgment: Judgment normal.      No results found for any visits on 10/08/24.    The 10-year ASCVD risk score (Arnett DK, et al., 2019) is: 14%    Assessment & Plan:   Problem List Items Addressed This Visit        Other   Lip swelling - Primary   Acute, etiology unclear Treat with oral prednisone 40mg /day x 5 days Possibly allergic in origin so also take daily antihistamine for 5-7 days Call if symptoms change/worsen/do not improve. Consider additional evaluation/referral pending progression of signs/symptoms if needed Discussed possible side effects/risks of prednisone and what to do if they were to occur      Relevant Medications   predniSONE (DELTASONE) 20 MG tablet    Return if symptoms worsen or fail to improve.    [name], NP

## 2024-10-08 NOTE — Telephone Encounter (Signed)
    FYI Only or Action Required?: FYI only for provider.  Patient was last seen in primary care on 09/10/2024 by Jerrell Cleatus Ned, MD.  Called Nurse Triage reporting Facial Swelling.  Symptoms began several weeks ago.  Interventions attempted: OTC medications: vasaline.  Symptoms are: gradually worsening.  Triage Disposition: See PCP When Office is Open (Within 3 Days)  Patient/caregiver understands and will follow disposition?: Yes     Copied from CRM [id number]. Topic: Clinical - Red Word Triage >> Oct 08, 2024 10:55 AM Mercedes MATSU wrote: Red Word that prompted transfer to Nurse Triage: Patient called in stating that her upper lip is swollen and she does not know why. Last few weeks her lips have been chapped but now it has gotten worse. Reason for Disposition  Lip swelling lasts > 3 days  Answer Assessment - Initial Assessment Questions Pt states for the last couple weeks lip has been peeling not cracking Vasaline on it, lipstick that she's worn for years No new triggers    1. ONSET: When did the swelling start? (e.g., minutes, hours, days)     A week or two ago, gradual 2. SEVERITY: How swollen is it? (e.g., part of an upper or lower lip, both lips)    Upper lip 3. ITCHING: Is there any itching? If Yes, ask: How much?   (Scale 1-10; mild, moderate or severe)     no 4. PAIN: Is the swelling painful to touch? If Yes, ask: How painful is it?   (Scale 1-10; mild, moderate or severe)     no 5. CAUSE: What do you think is causing the lip swelling? (e.g., certain food or medicine, recent cosmetic treatment)     *No Answer* 6. RECURRENT SYMPTOM: Have you had lip swelling before? If Yes, ask: When was the last time? What happened that time?     *No Answer* 7. OTHER SYMPTOMS: Do you have any other symptoms? (e.g., fever, toothache)     *No Answer* 8. PREGNANCY: Is there any chance you are pregnant? When was your last menstrual period?     *No  Answer*  Protocols used: Lip Swelling-A-[facility]

## 2024-10-08 NOTE — Patient Instructions (Signed)
 You can try an antihistamine medication such as Zyrtec OR Allegra OR Benadryl and see if this help symptoms as well. Benadryl can be sedating so if you choose to take this take it at night.

## 2024-10-11 ENCOUNTER — Other Ambulatory Visit: Payer: Self-pay | Admitting: Obstetrics and Gynecology

## 2024-10-11 DIAGNOSIS — Z1231 Encounter for screening mammogram for malignant neoplasm of breast: Secondary | ICD-10-CM

## 2024-10-26 ENCOUNTER — Telehealth: Payer: Self-pay

## 2024-10-26 NOTE — Telephone Encounter (Signed)
 Copied from CRM [id number]. Topic: Clinical - Medication Question >> Oct 26, 2024  9:53 AM Kimberly Tran wrote: Reason for CRM: Patient has a sinus infection and would like some medication called in for it, could you inform her by phone if that can be done or if she needs to come in for it to be done call back [phone]

## 2024-10-27 ENCOUNTER — Encounter: Payer: Self-pay | Admitting: Emergency Medicine

## 2024-10-27 ENCOUNTER — Ambulatory Visit (INDEPENDENT_AMBULATORY_CARE_PROVIDER_SITE_OTHER): Admitting: Emergency Medicine

## 2024-10-27 ENCOUNTER — Ambulatory Visit: Payer: Self-pay

## 2024-10-27 VITALS — BP 138/86 | HR 91 | Temp 97.7°F | Ht 66.0 in | Wt 140.0 lb

## 2024-10-27 DIAGNOSIS — K5792 Diverticulitis of intestine, part unspecified, without perforation or abscess without bleeding: Secondary | ICD-10-CM | POA: Diagnosis not present

## 2024-10-27 MED ORDER — METRONIDAZOLE 250 MG PO TABS: 250.0000 mg | ORAL_TABLET | Freq: Three times a day (TID) | ORAL | 0 refills | Status: AC

## 2024-10-27 MED ORDER — CIPROFLOXACIN HCL 500 MG PO TABS: 500.0000 mg | ORAL_TABLET | Freq: Two times a day (BID) | ORAL | 0 refills | Status: AC

## 2024-10-27 NOTE — Telephone Encounter (Signed)
 FYI Only or Action Required?: FYI only for provider: appointment scheduled on 10/27/24.  Patient was last seen in primary care on 10/08/2024 by Elnor Lauraine BRAVO, NP.  Called Nurse Triage reporting Abdominal Pain.  Symptoms began several days ago.  Interventions attempted: Rest, hydration, or home remedies.  Symptoms are: unchanged.  Triage Disposition: See HCP Within 4 Hours (Or PCP Triage)  Patient/caregiver understands and will follow disposition?:  Reason for Disposition  [1] MILD-MODERATE pain AND [2] constant AND [3] age > 60 years  Answer Assessment - Initial Assessment Questions Pt reports hx of diverticulitis, believes she may have a mild flare up. States most recent occurrence was in September and was treated with cipro  and metronidazole . States that the metronidazole  occassionally causes her constipation. Educated on miralax PRN if needed in the future, but she mentioned that last time the dose was lower and she was able to tolerate that better.   1. LOCATION: Where does it hurt?      Began on left side, near waist but now is across lower abdomen  2. RADIATION: Does the pain shoot anywhere else? (e.g., chest, back)     Across lower abdomen  3. ONSET: When did the pain begin? (e.g., minutes, hours or days ago)      2-3 days  4. SUDDEN: Gradual or sudden onset?     Sudden, when she woke up  5. PATTERN Does the pain come and go, or is it constant?     Constant, worse with pressure  6. SEVERITY: How bad is the pain?  (e.g., Scale 1-10; mild, moderate, or severe)     Mild  7. RECURRENT SYMPTOM: Have you ever had this type of stomach pain before? If Yes, ask: When was the last time? and What happened that time?      Yes, hx of same  Protocols used: Abdominal Pain - [facility] Copied from CRM [id number]. Topic: Clinical - Red Word Triage >> Oct 27, 2024  8:29 AM Adelita E wrote: [facility] that prompted transfer to Nurse Triage: Patient is having some  left side pain near her waistline, sharp pains. Patient think it may be a case of mild diverticulitis.

## 2024-10-27 NOTE — Assessment & Plan Note (Signed)
 Clinically stable.  No red flag signs or symptoms. Benign abdominal examination.  Afebrile. Diet and nutrition discussed. Colonoscopy report from 2019 reviewed. Recommend 7-day course of Cipro  500 mg twice a day and Flagyl  250 mg 3 times a day. ED precautions given. Recommend follow-up with PCP GI office visit 09/10/2024 assessment and plan as follows: Recurrent mild diverticulitis is likely to resolve spontaneously. Chronic diverticulosis increases recurrence risk. No surgical intervention is needed. Prescribed a 5-day course of Ciprofloxacin  and Metronidazole . Recommended anti-inflammatories like ibuprofen or Tylenol  for discomfort. Advised regular, soft bowel movements using fiber or Miralax. Instructed to avoid alcohol while taking Metronidazole . Reassure that no dietary restrictions are necessary; maintain a balanced diet. Discussed the chronic nature of diverticulosis and potential for recurrence.

## 2024-10-27 NOTE — Patient Instructions (Signed)
 Diverticulitis  Diverticulitis is when small pouches in your colon get infected or swollen. This causes pain in your belly (abdomen) and watery poop (diarrhea). The small pouches are called diverticula. They may form if you have a condition called diverticulosis. What are the causes? You may get this condition if poop (stool) gets trapped in the pouches in your colon. The poop lets germs (bacteria) grow. This causes an infection. What increases the risk? You are more likely to get this condition if you have small pouches in your colon. You are also more likely to get it if: You are overweight or very overweight (obese). You do not exercise enough. You drink alcohol. You smoke. You eat a lot of red meat, like beef, pork, or lamb. You do not eat enough fiber. You are older than 73 years of age. What are the signs or symptoms? Pain in your belly. Pain is often on the left side, but it may be felt in other spots too. Fever and chills. Feeling like you may vomit. Vomiting. Having cramps. Feeling full. Changes in how often you poop. Blood in your poop. How is this treated? Most cases are treated at home. You may be told to: Take over-the-counter pain medicines. Only eat and drink clear liquids. Take antibiotics. Rest. Very bad cases may need to be treated at a hospital. Treatment may include: Not eating or drinking. Taking pain medicines. Getting antibiotics through an IV tube. Getting fluid and food through an IV tube. Having surgery. When you are feeling better, you may need to have a test to look at your colon (colonoscopy). Follow these instructions at home: Medicines Take over-the-counter and prescription medicines only as told by your doctor. These include: Fiber pills. Probiotics. Medicines to make your poop soft (stool softeners). If you were prescribed antibiotics, take them as told by your doctor. Do not stop taking them even if you start to feel better. Ask your  doctor if you should avoid driving or using machines while you are taking your medicine. Eating and drinking  Follow the diet told by your doctor. You may need to only eat and drink liquids. When you feel better, you may be able to eat more foods. You may also be told to eat a lot of fiber. Fiber helps you poop. Foods with fiber include berries, beans, lentils, and green vegetables. Try not to eat red meat. General instructions Do not smoke or use any products that contain nicotine or tobacco. If you need help quitting, ask your doctor. Exercise 3 or more times a week. Try to go for 30 minutes each time. Exercise enough to sweat and make your heart beat faster. Contact a doctor if: Your pain gets worse. You are not pooping like normal. Your symptoms do not get better. Your symptoms get worse very fast. You have a fever. You vomit more than one time. You have poop that is: Bloody. Black. Tarry. This information is not intended to replace advice given to you by your health care provider. Make sure you discuss any questions you have with your health care provider. Document Revised: 08/29/2022 Document Reviewed: 08/29/2022 Elsevier Patient Education  2024 [employer].

## 2024-10-27 NOTE — Telephone Encounter (Signed)
 OV today

## 2024-10-27 NOTE — Progress Notes (Signed)
 Kimberly Tran 73 y.o.   Chief Complaint  Patient presents with   Abdominal Pain    Pain started on Monday and its located lower left side and now moved to the front.     HISTORY OF PRESENT ILLNESS: This is a 73 y.o. female with history of diverticulosis complaining of left lower abdominal pain that started 2 to 3 days ago. Think she is having a mild case of diverticulitis.  She has had them before. Denies nausea or vomiting, fever or chills, intractable abdominal pain, lower GI bleed, melena or any other associated symptoms. No other complaints or medical concerns today  Abdominal Pain Associated symptoms include constipation. Pertinent negatives include no dysuria, fever, headaches or hematuria.     Prior to Admission medications   Medication Sig Start Date End Date Taking? Authorizing Provider  clobetasol cream (TEMOVATE) 0.05 % Apply 1 Application topically 2 (two) times daily.   Yes [provider]  predniSONE (DELTASONE) 20 MG tablet Take 2 tablets (40 mg total) by mouth daily with breakfast. 10/08/24  Yes Elnor Lauraine BRAVO, NP  SYNTHROID  50 MCG tablet TAKE 1 TABLET BY MOUTH DAILY EVERY MONDAY THRU FRIDAY AND 2 TABLETS DAILY ON SATURDAYS AND SUNDAYS 08/18/24  Yes Rollene Almarie LABOR, MD  valACYclovir (VALTREX) 500 MG tablet Take 500 mg by mouth 2 (two) times daily. 08/03/24  Yes [provider]    No Known Allergies  Patient Active Problem List   Diagnosis Date Noted   Lip swelling 10/08/2024   Acute diverticulitis 07/02/2023   Hx of diverticulitis of colon 05/16/2022   Pre-diabetes 01/29/2022   HLD (hyperlipidemia) 01/29/2022   Hypothyroid 07/30/2021   Routine general medical examination at a health care facility 07/30/2021    Past Medical History:  Diagnosis Date   Arthritis    fingers   Atypical mole 01/30/2005   slight atypia on abdomen   Atypical mole 10/21/2006   slight to moderate atypia on left calf   Atypical mole 02/15/2008   slight to  moderate atypia on right anterior thigh   Atypical mole 11/02/2008   slight atypia on right buttock   Cataract    Hyperlipidemia    Squamous cell carcinoma of skin 05/03/2020   Atypical squamous proliferation-left thigh posterior- cx3, cautery 68fu   Thyroid  disease     Past Surgical History:  Procedure Laterality Date   COLONOSCOPY     HERNIA REPAIR      Social History   Socioeconomic History   Marital status: Single    Spouse name: Not on file   Number of children: Not on file   Years of education: Not on file   Highest education level: Not on file  Occupational History   Not on file  Tobacco Use   Smoking status: Never    Passive exposure: Never   Smokeless tobacco: Never  Vaping Use   Vaping status: Never Used  Substance and Sexual Activity   Alcohol use: No   Drug use: Never   Sexual activity: Not Currently  Other Topics Concern   Not on file  Social History Narrative   Single   Social Drivers of Health   Financial Resource Strain: Low Risk  (04/26/2024)   Overall Financial Resource Strain (CARDIA)    Difficulty of Paying Living Expenses: Not hard at all  Food Insecurity: No Food Insecurity (04/26/2024)   Hunger Vital Sign    Worried About Running Out of Food in the Last Year: Never true  Ran Out of Food in the Last Year: Never true  Transportation Needs: No Transportation Needs (04/26/2024)   PRAPARE - Administrator, Civil Service (Medical): No    Lack of Transportation (Non-Medical): No  Physical Activity: Sufficiently Active (04/26/2024)   Exercise Vital Sign    Days of Exercise per Week: 7 days    Minutes of Exercise per Session: 60 min  Stress: No Stress Concern Present (04/26/2024)   [employer] of Occupational Health - Occupational Stress Questionnaire    Feeling of Stress : Not at all  Social Connections: Moderately Integrated (04/26/2024)   Social Connection and Isolation Panel    Frequency of Communication with Friends and  Family: More than three times a week    Frequency of Social Gatherings with Friends and Family: More than three times a week    Attends Religious Services: More than 4 times per year    Active Member of [employer] or Organizations: Yes    Attends Banker Meetings: More than 4 times per year    Marital Status: Never married  Intimate Partner Violence: Not At Risk (04/26/2024)   Humiliation, Afraid, Rape, and Kick questionnaire    Fear of Current or Ex-Partner: No    Emotionally Abused: No    Physically Abused: No    Sexually Abused: No    Family History  Problem Relation Age of Onset   Lung cancer Mother    Diabetes Father    Heart disease Father    Hypertension Brother    Colon cancer Neg Hx    Colon polyps Neg Hx    Esophageal cancer Neg Hx    Rectal cancer Neg Hx    Stomach cancer Neg Hx      Review of Systems  Constitutional: Negative.  Negative for chills and fever.  HENT: Negative.  Negative for congestion and sore throat.   Respiratory: Negative.  Negative for cough and shortness of breath.   Cardiovascular: Negative.  Negative for chest pain and palpitations.  Gastrointestinal:  Positive for abdominal pain and constipation.  Genitourinary: Negative.  Negative for dysuria and hematuria.  Skin: Negative.  Negative for rash.  Neurological: Negative.  Negative for dizziness and headaches.  All other systems reviewed and are negative.   Vitals:   10/27/24 1443  BP: 138/86  Pulse: 91  Temp: 97.7 F (36.5 C)  SpO2: 99%    Physical Exam Vitals reviewed.  Constitutional:      Appearance: She is well-developed.  HENT:     Head: Normocephalic.  Eyes:     Extraocular Movements: Extraocular movements intact.  Cardiovascular:     Rate and Rhythm: Normal rate.  Pulmonary:     Effort: Pulmonary effort is normal.  Abdominal:     Palpations: Abdomen is soft.     Tenderness: There is abdominal tenderness (Mild tenderness to deep palpation left lower abdomen).  There is no guarding.  Skin:    General: Skin is warm and dry.  Neurological:     Mental Status: She is alert and oriented to person, place, and time.  Psychiatric:        Mood and Affect: Mood normal.        Behavior: Behavior normal.      ASSESSMENT & PLAN: A total of 40 minutes was spent with the patient and counseling/coordination of care regarding preparing for this visit, review of most recent office visit notes, review of most recent GI office visit notes, review of multiple  chronic medical conditions and their management, diagnosis of acute diverticulitis and management, need for antibiotics, pain management, review of all medications, review of most recent bloodwork results, education on nutrition, prognosis, documentation, and need for follow up.   Problem List Items Addressed This Visit       Digestive   Acute diverticulitis - Primary   Clinically stable.  No red flag signs or symptoms. Benign abdominal examination.  Afebrile. Diet and nutrition discussed. Colonoscopy report from 2019 reviewed. Recommend 7-day course of Cipro  500 mg twice a day and Flagyl  250 mg 3 times a day. ED precautions given. Recommend follow-up with PCP GI office visit 09/10/2024 assessment and plan as follows: Recurrent mild diverticulitis is likely to resolve spontaneously. Chronic diverticulosis increases recurrence risk. No surgical intervention is needed. Prescribed a 5-day course of Ciprofloxacin  and Metronidazole . Recommended anti-inflammatories like ibuprofen or Tylenol  for discomfort. Advised regular, soft bowel movements using fiber or Miralax. Instructed to avoid alcohol while taking Metronidazole . Reassure that no dietary restrictions are necessary; maintain a balanced diet. Discussed the chronic nature of diverticulosis and potential for recurrence.       Relevant Medications   ciprofloxacin  (CIPRO ) 500 MG tablet   metroNIDAZOLE  (FLAGYL ) 250 MG tablet   Patient Instructions   Diverticulitis  Diverticulitis is when small pouches in your colon get infected or swollen. This causes pain in your belly (abdomen) and watery poop (diarrhea). The small pouches are called diverticula. They may form if you have a condition called diverticulosis. What are the causes? You may get this condition if poop (stool) gets trapped in the pouches in your colon. The poop lets germs (bacteria) grow. This causes an infection. What increases the risk? You are more likely to get this condition if you have small pouches in your colon. You are also more likely to get it if: You are overweight or very overweight (obese). You do not exercise enough. You drink alcohol. You smoke. You eat a lot of red meat, like beef, pork, or lamb. You do not eat enough fiber. You are older than 73 years of age. What are the signs or symptoms? Pain in your belly. Pain is often on the left side, but it may be felt in other spots too. Fever and chills. Feeling like you may vomit. Vomiting. Having cramps. Feeling full. Changes in how often you poop. Blood in your poop. How is this treated? Most cases are treated at home. You may be told to: Take over-the-counter pain medicines. Only eat and drink clear liquids. Take antibiotics. Rest. Very bad cases may need to be treated at a hospital. Treatment may include: Not eating or drinking. Taking pain medicines. Getting antibiotics through an IV tube. Getting fluid and food through an IV tube. Having surgery. When you are feeling better, you may need to have a test to look at your colon (colonoscopy). Follow these instructions at home: Medicines Take over-the-counter and prescription medicines only as told by your doctor. These include: Fiber pills. Probiotics. Medicines to make your poop soft (stool softeners). If you were prescribed antibiotics, take them as told by your doctor. Do not stop taking them even if you start to feel better. Ask your  doctor if you should avoid driving or using machines while you are taking your medicine. Eating and drinking  Follow the diet told by your doctor. You may need to only eat and drink liquids. When you feel better, you may be able to eat more foods. You may also be told  to eat a lot of fiber. Fiber helps you poop. Foods with fiber include berries, beans, lentils, and green vegetables. Try not to eat red meat. General instructions Do not smoke or use any products that contain nicotine or tobacco. If you need help quitting, ask your doctor. Exercise 3 or more times a week. Try to go for 30 minutes each time. Exercise enough to sweat and make your heart beat faster. Contact a doctor if: Your pain gets worse. You are not pooping like normal. Your symptoms do not get better. Your symptoms get worse very fast. You have a fever. You vomit more than one time. You have poop that is: Bloody. Black. Tarry. This information is not intended to replace advice given to you by your health care provider. Make sure you discuss any questions you have with your health care provider. Document Revised: 08/29/2022 Document Reviewed: 08/29/2022 Elsevier Patient Education  2024 Elsevier Inc.    Emil Schaumann, MD [facility]

## 2024-10-28 NOTE — Telephone Encounter (Signed)
 Needs acute apt

## 2024-10-28 NOTE — Telephone Encounter (Signed)
 Pt sated that she is feeling fine now. No appt is needed.

## 2024-11-23 ENCOUNTER — Ambulatory Visit
Admission: RE | Admit: 2024-11-23 | Discharge: 2024-11-23 | Disposition: A | Source: Ambulatory Visit | Attending: Obstetrics and Gynecology | Admitting: Obstetrics and Gynecology

## 2024-11-23 DIAGNOSIS — Z1231 Encounter for screening mammogram for malignant neoplasm of breast: Secondary | ICD-10-CM

## 2024-12-10 ENCOUNTER — Ambulatory Visit: Admitting: Family Medicine

## 2025-01-17 ENCOUNTER — Ambulatory Visit: Admitting: Internal Medicine

## 2025-01-24 ENCOUNTER — Ambulatory Visit: Admitting: Internal Medicine
